# Patient Record
Sex: Female | Born: 1945 | Race: White | Hispanic: No | Marital: Single | Smoking: Never smoker
Health system: Southern US, Community
[De-identification: ages and names within clinical notes are randomized; demographics above are authoritative.]

## PROBLEM LIST (undated history)

## (undated) DIAGNOSIS — G43909 Migraine, unspecified, not intractable, without status migrainosus: Secondary | ICD-10-CM

## (undated) DIAGNOSIS — M1990 Unspecified osteoarthritis, unspecified site: Secondary | ICD-10-CM

## (undated) HISTORY — PX: ELBOW ARTHROSCOPY: SUR87

## (undated) HISTORY — PX: FOOT SURGERY: SHX648

## (undated) HISTORY — PX: KNEE SURGERY: SHX244

---

## 2003-01-24 ENCOUNTER — Encounter: Payer: Self-pay | Admitting: Family Medicine

## 2003-01-24 ENCOUNTER — Ambulatory Visit (HOSPITAL_COMMUNITY): Admission: RE | Admit: 2003-01-24 | Discharge: 2003-01-24 | Payer: Self-pay | Admitting: Family Medicine

## 2008-02-22 ENCOUNTER — Ambulatory Visit (HOSPITAL_COMMUNITY): Admission: RE | Admit: 2008-02-22 | Discharge: 2008-02-22 | Payer: Self-pay | Admitting: Family Medicine

## 2009-01-04 ENCOUNTER — Other Ambulatory Visit: Admission: RE | Admit: 2009-01-04 | Discharge: 2009-01-04 | Payer: Self-pay | Admitting: Obstetrics and Gynecology

## 2010-10-18 ENCOUNTER — Other Ambulatory Visit (HOSPITAL_COMMUNITY): Payer: Self-pay | Admitting: Family Medicine

## 2010-10-18 ENCOUNTER — Ambulatory Visit (HOSPITAL_COMMUNITY)
Admission: RE | Admit: 2010-10-18 | Discharge: 2010-10-18 | Disposition: A | Discharge status: Home / Self Care | Payer: BC Managed Care – PPO | Source: Ambulatory Visit | Attending: Family Medicine | Admitting: Family Medicine

## 2010-10-18 DIAGNOSIS — T148XXA Other injury of unspecified body region, initial encounter: Secondary | ICD-10-CM

## 2010-10-18 DIAGNOSIS — T1490XA Injury, unspecified, initial encounter: Secondary | ICD-10-CM

## 2010-10-18 DIAGNOSIS — M25579 Pain in unspecified ankle and joints of unspecified foot: Secondary | ICD-10-CM | POA: Insufficient documentation

## 2010-10-18 DIAGNOSIS — S99919A Unspecified injury of unspecified ankle, initial encounter: Secondary | ICD-10-CM | POA: Insufficient documentation

## 2010-10-18 DIAGNOSIS — S8990XA Unspecified injury of unspecified lower leg, initial encounter: Secondary | ICD-10-CM | POA: Insufficient documentation

## 2010-10-18 DIAGNOSIS — W2203XA Walked into furniture, initial encounter: Secondary | ICD-10-CM | POA: Insufficient documentation

## 2011-08-28 ENCOUNTER — Ambulatory Visit (HOSPITAL_COMMUNITY)
Admission: RE | Admit: 2011-08-28 | Discharge: 2011-08-28 | Disposition: A | Discharge status: Home / Self Care | Payer: BC Managed Care – PPO | Source: Ambulatory Visit | Attending: Family Medicine | Admitting: Family Medicine

## 2011-08-28 ENCOUNTER — Other Ambulatory Visit (HOSPITAL_COMMUNITY): Payer: Self-pay | Admitting: Family Medicine

## 2011-08-28 DIAGNOSIS — M25569 Pain in unspecified knee: Secondary | ICD-10-CM | POA: Insufficient documentation

## 2011-08-28 DIAGNOSIS — M25561 Pain in right knee: Secondary | ICD-10-CM

## 2011-08-28 DIAGNOSIS — M1120 Other chondrocalcinosis, unspecified site: Secondary | ICD-10-CM | POA: Insufficient documentation

## 2011-08-28 DIAGNOSIS — R609 Edema, unspecified: Secondary | ICD-10-CM

## 2016-07-29 DIAGNOSIS — M79662 Pain in left lower leg: Secondary | ICD-10-CM | POA: Diagnosis not present

## 2016-07-29 DIAGNOSIS — G43909 Migraine, unspecified, not intractable, without status migrainosus: Secondary | ICD-10-CM | POA: Diagnosis not present

## 2016-07-29 DIAGNOSIS — E6609 Other obesity due to excess calories: Secondary | ICD-10-CM | POA: Diagnosis not present

## 2016-07-29 DIAGNOSIS — Z13220 Encounter for screening for lipoid disorders: Secondary | ICD-10-CM | POA: Diagnosis not present

## 2016-07-29 DIAGNOSIS — J3089 Other allergic rhinitis: Secondary | ICD-10-CM | POA: Diagnosis not present

## 2016-12-23 DIAGNOSIS — R21 Rash and other nonspecific skin eruption: Secondary | ICD-10-CM | POA: Diagnosis not present

## 2017-01-29 DIAGNOSIS — Z91010 Allergy to peanuts: Secondary | ICD-10-CM | POA: Diagnosis not present

## 2017-01-29 DIAGNOSIS — E7800 Pure hypercholesterolemia, unspecified: Secondary | ICD-10-CM | POA: Diagnosis not present

## 2017-01-29 DIAGNOSIS — J301 Allergic rhinitis due to pollen: Secondary | ICD-10-CM | POA: Diagnosis not present

## 2017-01-29 DIAGNOSIS — G43909 Migraine, unspecified, not intractable, without status migrainosus: Secondary | ICD-10-CM | POA: Diagnosis not present

## 2017-04-01 DIAGNOSIS — J0100 Acute maxillary sinusitis, unspecified: Secondary | ICD-10-CM | POA: Diagnosis not present

## 2017-07-14 DIAGNOSIS — J209 Acute bronchitis, unspecified: Secondary | ICD-10-CM | POA: Diagnosis not present

## 2017-07-14 DIAGNOSIS — J111 Influenza due to unidentified influenza virus with other respiratory manifestations: Secondary | ICD-10-CM | POA: Diagnosis not present

## 2017-08-06 DIAGNOSIS — J301 Allergic rhinitis due to pollen: Secondary | ICD-10-CM | POA: Diagnosis not present

## 2017-08-06 DIAGNOSIS — E6609 Other obesity due to excess calories: Secondary | ICD-10-CM | POA: Diagnosis not present

## 2017-08-06 DIAGNOSIS — Z79891 Long term (current) use of opiate analgesic: Secondary | ICD-10-CM | POA: Diagnosis not present

## 2017-08-06 DIAGNOSIS — J3089 Other allergic rhinitis: Secondary | ICD-10-CM | POA: Diagnosis not present

## 2017-08-06 DIAGNOSIS — G43909 Migraine, unspecified, not intractable, without status migrainosus: Secondary | ICD-10-CM | POA: Diagnosis not present

## 2017-08-06 DIAGNOSIS — E7800 Pure hypercholesterolemia, unspecified: Secondary | ICD-10-CM | POA: Diagnosis not present

## 2017-08-06 LAB — LAB REPORT - SCANNED
Calcium: 9.7
EGFR: 68

## 2017-08-13 ENCOUNTER — Other Ambulatory Visit: Payer: Self-pay

## 2017-08-13 ENCOUNTER — Emergency Department (HOSPITAL_COMMUNITY)
Admission: EM | Admit: 2017-08-13 | Discharge: 2017-08-13 | Disposition: A | Discharge status: Home / Self Care | Payer: Medicare Other | Attending: Emergency Medicine | Admitting: Emergency Medicine

## 2017-08-13 ENCOUNTER — Encounter (HOSPITAL_COMMUNITY): Payer: Self-pay | Admitting: Emergency Medicine

## 2017-08-13 ENCOUNTER — Emergency Department (HOSPITAL_COMMUNITY): Payer: Medicare Other

## 2017-08-13 DIAGNOSIS — Y999 Unspecified external cause status: Secondary | ICD-10-CM | POA: Diagnosis not present

## 2017-08-13 DIAGNOSIS — Y9389 Activity, other specified: Secondary | ICD-10-CM | POA: Insufficient documentation

## 2017-08-13 DIAGNOSIS — W010XXA Fall on same level from slipping, tripping and stumbling without subsequent striking against object, initial encounter: Secondary | ICD-10-CM | POA: Insufficient documentation

## 2017-08-13 DIAGNOSIS — S3982XA Other specified injuries of lower back, initial encounter: Secondary | ICD-10-CM | POA: Diagnosis present

## 2017-08-13 DIAGNOSIS — Y92481 Parking lot as the place of occurrence of the external cause: Secondary | ICD-10-CM | POA: Diagnosis not present

## 2017-08-13 DIAGNOSIS — M533 Sacrococcygeal disorders, not elsewhere classified: Secondary | ICD-10-CM | POA: Diagnosis not present

## 2017-08-13 DIAGNOSIS — S29012A Strain of muscle and tendon of back wall of thorax, initial encounter: Secondary | ICD-10-CM | POA: Diagnosis not present

## 2017-08-13 DIAGNOSIS — S300XXA Contusion of lower back and pelvis, initial encounter: Secondary | ICD-10-CM | POA: Diagnosis not present

## 2017-08-13 DIAGNOSIS — M542 Cervicalgia: Secondary | ICD-10-CM | POA: Diagnosis not present

## 2017-08-13 HISTORY — DX: Migraine, unspecified, not intractable, without status migrainosus: G43.909

## 2017-08-13 NOTE — ED Triage Notes (Addendum)
Pt was walking in [employer] parking lot on Sunday and slipped on grease, falling on her butt and back.  C/o of tailbone and back pain. Denies hitting head.

## 2017-08-13 NOTE — ED Notes (Signed)
Pt reports she slipped on grease in the parking lot of [facility] Sunday.  Reports landed mostly on her buttocks.  C/O pain from buttocks up to shoulders.  Denies hitting head, denies any loss of consciousness.

## 2017-08-13 NOTE — ED Provider Notes (Signed)
[facility] EMERGENCY DEPARTMENT Provider Note   CSN: [id number] Arrival date & time: 08/13/17  1126     History   Chief Complaint Chief Complaint  Patient presents with  . Fall    HPI Claudia Griffith is a 72 y.o. female.  HPI Patient states that 3 days ago she slipped on some oil in the parking lot and landed on her buttocks.  Denies hitting her head.  No loss of consciousness.  She has had some soreness to the sacral region.  She denies any focal weakness or numbness.  Ambulating without any difficulty.  She is not on any blood thinners.  Patient also complains of some pain to her bilateral shoulders.  Denies any known trauma directly to the shoulders.  No chest pain or shortness of breath. Past Medical History:  Diagnosis Date  . Migraine     There are no active problems to display for this patient.   Past Surgical History:  Procedure Laterality Date  . FOOT SURGERY    . KNEE SURGERY       OB History   None      Home Medications    Prior to Admission medications   Medication Sig Start Date End Date Taking? Authorizing Provider  atorvastatin (LIPITOR) 20 MG tablet Take 1 tablet by mouth daily. 08/06/17   [provider]  HYDROcodone-acetaminophen (NORCO/VICODIN) 5-325 MG tablet Take 1 tablet by mouth 2 (two) times daily as needed. 07/14/17   [provider]    Family History No family history on file.  Social History Social History   Tobacco Use  . Smoking status: Never Smoker  . Smokeless tobacco: Never Used  Substance Use Topics  . Alcohol use: Never    Frequency: Never  . Drug use: Never     Allergies   Ceftin [cefuroxime axetil]; Chocolate; Peanut-containing drug products; Pineapple; and Shellfish allergy   Review of Systems Review of Systems  Constitutional: Negative for chills and fever.  Eyes: Negative for visual disturbance.  Respiratory: Negative for shortness of breath.   Cardiovascular: Negative for chest pain.    Gastrointestinal: Negative for abdominal pain.  Genitourinary: Negative for dysuria, flank pain and frequency.  Musculoskeletal: Positive for back pain, myalgias and neck pain. Negative for arthralgias, gait problem and neck stiffness.  Skin: Negative for rash and wound.  Neurological: Negative for dizziness, syncope, weakness, light-headedness, numbness and headaches.  All other systems reviewed and are negative.    Physical Exam Updated Vital Signs BP 99/85 (BP Location: Right Arm)   Pulse 85   Temp 98.5 F (36.9 C) (Oral)   Resp 17   Ht 5\' 8"  (1.727 m)   Wt 89.4 kg (197 lb)   SpO2 100%   BMI 29.95 kg/m   Physical Exam  Constitutional: She is oriented to person, place, and time. She appears well-developed and well-nourished. No distress.  HENT:  Head: Normocephalic and atraumatic.  Mouth/Throat: Oropharynx is clear and moist.  Eyes: Pupils are equal, round, and reactive to light. EOM are normal.  Neck: Normal range of motion. Neck supple.  No posterior midline cervical tenderness to palpation.  Cardiovascular: Normal rate and regular rhythm.  Pulmonary/Chest: Effort normal and breath sounds normal.  Abdominal: Soft. Bowel sounds are normal. There is no tenderness. There is no rebound and no guarding.  Musculoskeletal: Normal range of motion. She exhibits tenderness. She exhibits no edema.  Patient has mild tenderness over the mid sacrum.  No midline thoracic or lumbar tenderness.  She does have mild bilateral trapezius spasm and tenderness.  Pelvis is stable.  Distal pulses are 2+.  Neurological: She is alert and oriented to person, place, and time.  5/5 motor in all extremities.  Ambulating without difficulty.  Sensation intact.  Skin: Skin is warm and dry. No rash noted. She is not diaphoretic. No erythema.  Psychiatric: She has a normal mood and affect. Her behavior is normal.  Nursing note and vitals reviewed.    ED Treatments / Results  Labs (all labs ordered are  listed, but only abnormal results are displayed) Labs Reviewed - No data to display  EKG None  Radiology Dg Sacrum/coccyx  Result Date: 08/13/2017 CLINICAL DATA:  Fall, left buttock pain, initial encounter. EXAM: SACRUM AND COCCYX - 2+ VIEW COMPARISON:  None. FINDINGS: Obturator rings are intact. Sacroiliac joints are symmetric. Sacrum is grossly intact although somewhat obscured by bowel contents. IMPRESSION: Sacrum appears grossly intact. Electronically Signed   By: Leanna Battles M.D.   On: 08/13/2017 12:16    Procedures Procedures (including critical care time)  Medications Ordered in ED Medications - No data to display   Initial Impression / Assessment and Plan / ED Course  I have reviewed the triage vital signs and the nursing notes.  Pertinent labs & imaging results that were available during my care of the patient were reviewed by me and considered in my medical decision making (see chart for details).     X-ray without acute findings.  We will treat for sacral contusion and upper back muscle strain/spasm.  Return precautions given.  Final Clinical Impressions(s) / ED Diagnoses   Final diagnoses:  Sacral contusion, initial encounter  Muscle strain of upper back    ED Discharge Orders    None       Loren Racer, MD 08/13/17 1240

## 2017-08-27 DIAGNOSIS — T63791A Toxic effect of contact with other venomous plant, accidental (unintentional), initial encounter: Secondary | ICD-10-CM | POA: Diagnosis not present

## 2017-08-27 DIAGNOSIS — E663 Overweight: Secondary | ICD-10-CM | POA: Diagnosis not present

## 2018-02-02 ENCOUNTER — Other Ambulatory Visit (HOSPITAL_COMMUNITY): Payer: Self-pay | Admitting: Family Medicine

## 2018-02-02 DIAGNOSIS — L989 Disorder of the skin and subcutaneous tissue, unspecified: Secondary | ICD-10-CM | POA: Diagnosis not present

## 2018-02-02 DIAGNOSIS — Z1231 Encounter for screening mammogram for malignant neoplasm of breast: Secondary | ICD-10-CM

## 2018-02-02 DIAGNOSIS — E7800 Pure hypercholesterolemia, unspecified: Secondary | ICD-10-CM | POA: Diagnosis not present

## 2018-02-02 DIAGNOSIS — Z91013 Allergy to seafood: Secondary | ICD-10-CM | POA: Diagnosis not present

## 2018-02-02 DIAGNOSIS — G43909 Migraine, unspecified, not intractable, without status migrainosus: Secondary | ICD-10-CM | POA: Diagnosis not present

## 2018-02-03 DIAGNOSIS — Z79899 Other long term (current) drug therapy: Secondary | ICD-10-CM | POA: Diagnosis not present

## 2018-02-03 DIAGNOSIS — E7800 Pure hypercholesterolemia, unspecified: Secondary | ICD-10-CM | POA: Diagnosis not present

## 2018-02-03 DIAGNOSIS — Z79891 Long term (current) use of opiate analgesic: Secondary | ICD-10-CM | POA: Diagnosis not present

## 2018-02-16 ENCOUNTER — Encounter (HOSPITAL_COMMUNITY): Payer: Self-pay

## 2018-02-16 ENCOUNTER — Ambulatory Visit (HOSPITAL_COMMUNITY)
Admission: RE | Admit: 2018-02-16 | Discharge: 2018-02-16 | Disposition: A | Discharge status: Home / Self Care | Payer: Medicare Other | Source: Ambulatory Visit | Attending: Family Medicine | Admitting: Family Medicine

## 2018-02-16 DIAGNOSIS — Z1231 Encounter for screening mammogram for malignant neoplasm of breast: Secondary | ICD-10-CM | POA: Diagnosis not present

## 2018-04-21 DIAGNOSIS — L538 Other specified erythematous conditions: Secondary | ICD-10-CM | POA: Diagnosis not present

## 2018-04-21 DIAGNOSIS — R208 Other disturbances of skin sensation: Secondary | ICD-10-CM | POA: Diagnosis not present

## 2018-04-21 DIAGNOSIS — L821 Other seborrheic keratosis: Secondary | ICD-10-CM | POA: Diagnosis not present

## 2018-04-21 DIAGNOSIS — L820 Inflamed seborrheic keratosis: Secondary | ICD-10-CM | POA: Diagnosis not present

## 2018-07-29 DIAGNOSIS — Z79891 Long term (current) use of opiate analgesic: Secondary | ICD-10-CM | POA: Diagnosis not present

## 2018-07-29 DIAGNOSIS — E7800 Pure hypercholesterolemia, unspecified: Secondary | ICD-10-CM | POA: Diagnosis not present

## 2018-07-29 DIAGNOSIS — G43909 Migraine, unspecified, not intractable, without status migrainosus: Secondary | ICD-10-CM | POA: Diagnosis not present

## 2018-07-29 DIAGNOSIS — J301 Allergic rhinitis due to pollen: Secondary | ICD-10-CM | POA: Diagnosis not present

## 2018-07-29 DIAGNOSIS — J3089 Other allergic rhinitis: Secondary | ICD-10-CM | POA: Diagnosis not present

## 2018-07-29 DIAGNOSIS — E6609 Other obesity due to excess calories: Secondary | ICD-10-CM | POA: Diagnosis not present

## 2019-02-24 DIAGNOSIS — G43909 Migraine, unspecified, not intractable, without status migrainosus: Secondary | ICD-10-CM | POA: Diagnosis not present

## 2019-02-24 DIAGNOSIS — E6609 Other obesity due to excess calories: Secondary | ICD-10-CM | POA: Diagnosis not present

## 2019-02-24 DIAGNOSIS — J301 Allergic rhinitis due to pollen: Secondary | ICD-10-CM | POA: Diagnosis not present

## 2019-02-24 DIAGNOSIS — E7800 Pure hypercholesterolemia, unspecified: Secondary | ICD-10-CM | POA: Diagnosis not present

## 2019-03-29 ENCOUNTER — Other Ambulatory Visit (HOSPITAL_COMMUNITY): Payer: Self-pay | Admitting: Family Medicine

## 2019-03-29 DIAGNOSIS — Z1231 Encounter for screening mammogram for malignant neoplasm of breast: Secondary | ICD-10-CM

## 2019-04-21 ENCOUNTER — Ambulatory Visit (HOSPITAL_COMMUNITY)
Admission: RE | Admit: 2019-04-21 | Discharge: 2019-04-21 | Disposition: A | Discharge status: Home / Self Care | Payer: Medicare Other | Source: Ambulatory Visit | Attending: Family Medicine | Admitting: Family Medicine

## 2019-04-21 ENCOUNTER — Other Ambulatory Visit: Payer: Self-pay

## 2019-04-21 DIAGNOSIS — Z1231 Encounter for screening mammogram for malignant neoplasm of breast: Secondary | ICD-10-CM | POA: Diagnosis not present

## 2019-06-06 IMAGING — MG DIGITAL SCREENING BILATERAL MAMMOGRAM WITH TOMO AND CAD
6 of 11 series · 6 of 31 positions shown · non-contrast
Comparison: Previous exam(s).

CLINICAL DATA: Screening.

EXAM:
DIGITAL SCREENING BILATERAL MAMMOGRAM WITH TOMO AND CAD

[L MLO synth-2D (1 of 2)]
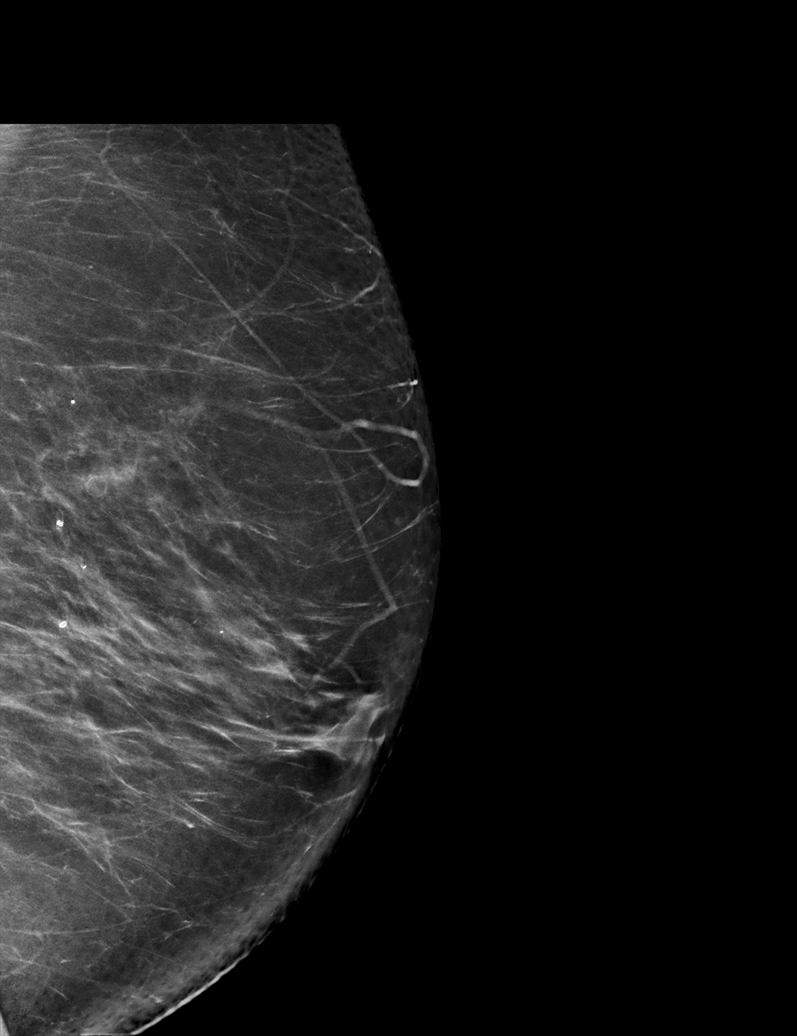

[L MLO synth-2D (2 of 2)]
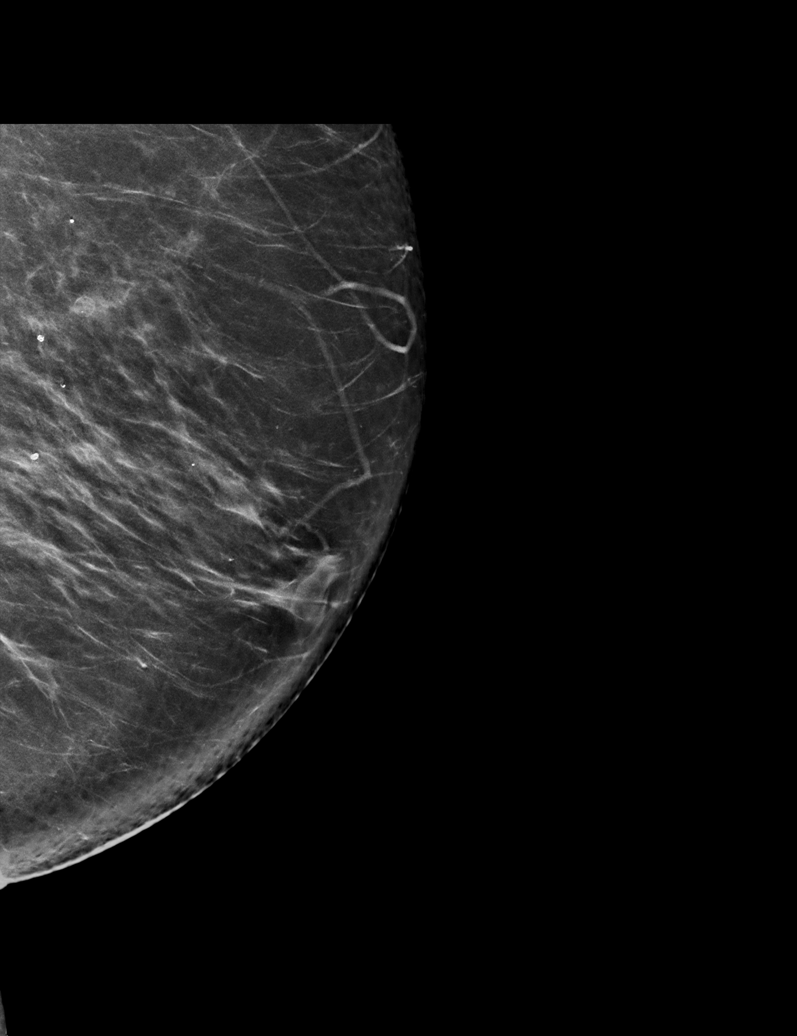

[R MLO synth-2D (1 of 2)]
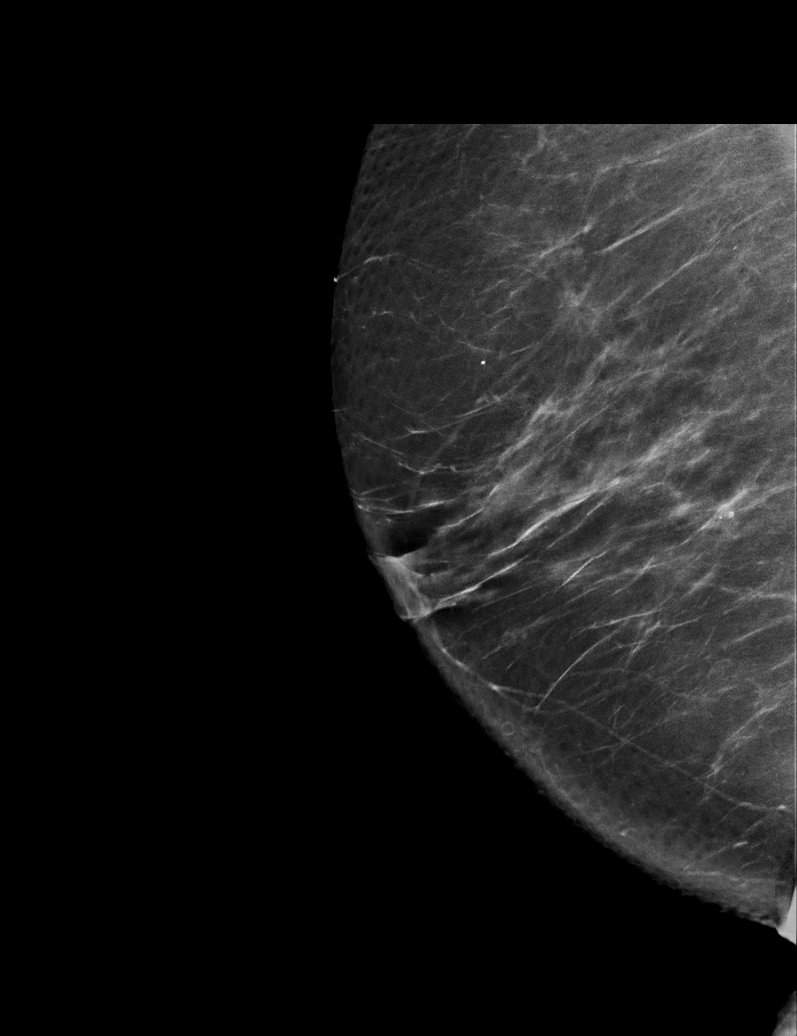

[L CC synth-2D]
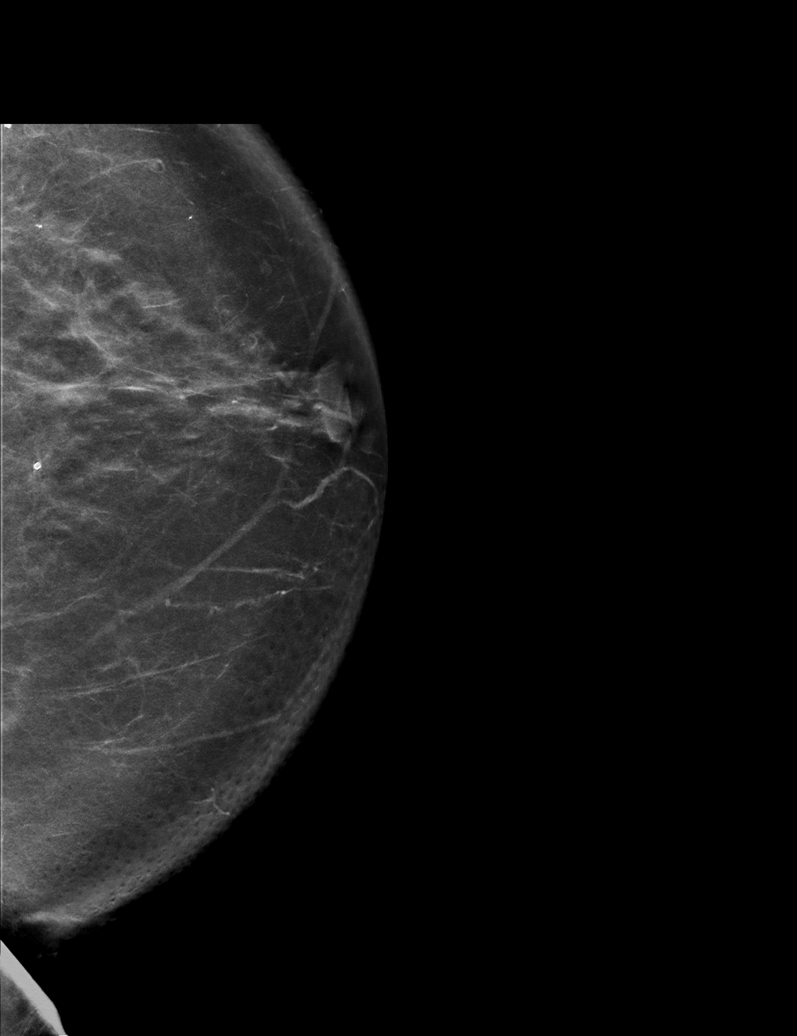

[R MLO synth-2D (2 of 2)]
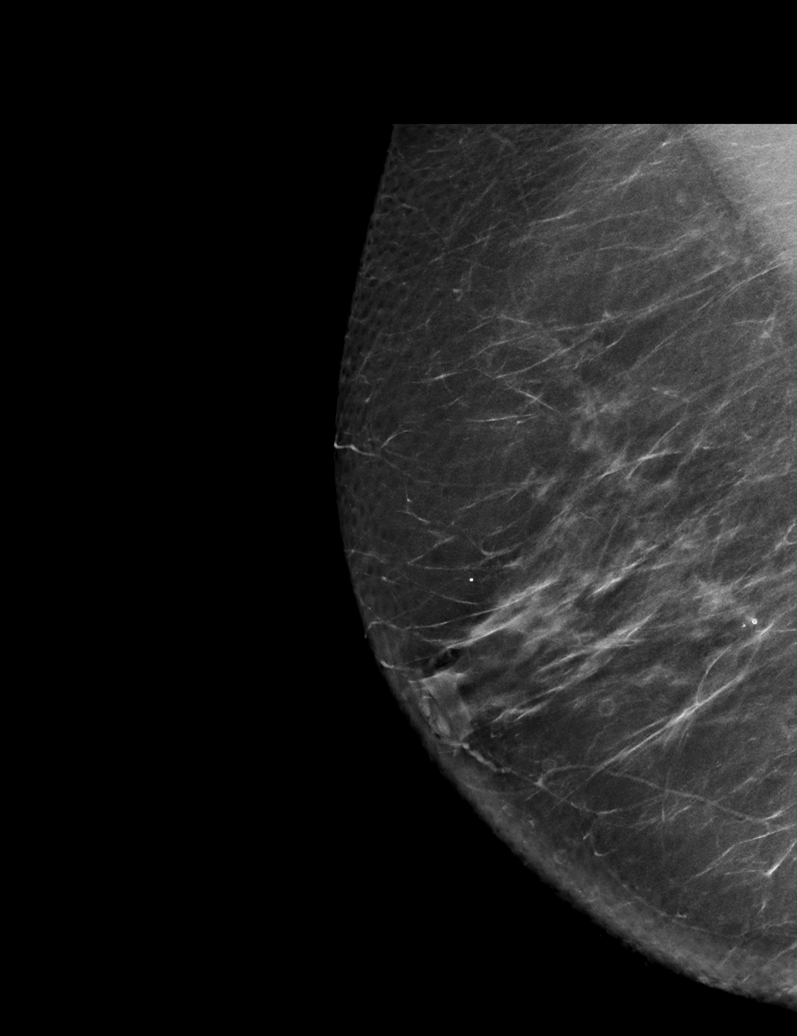

[R CC synth-2D]
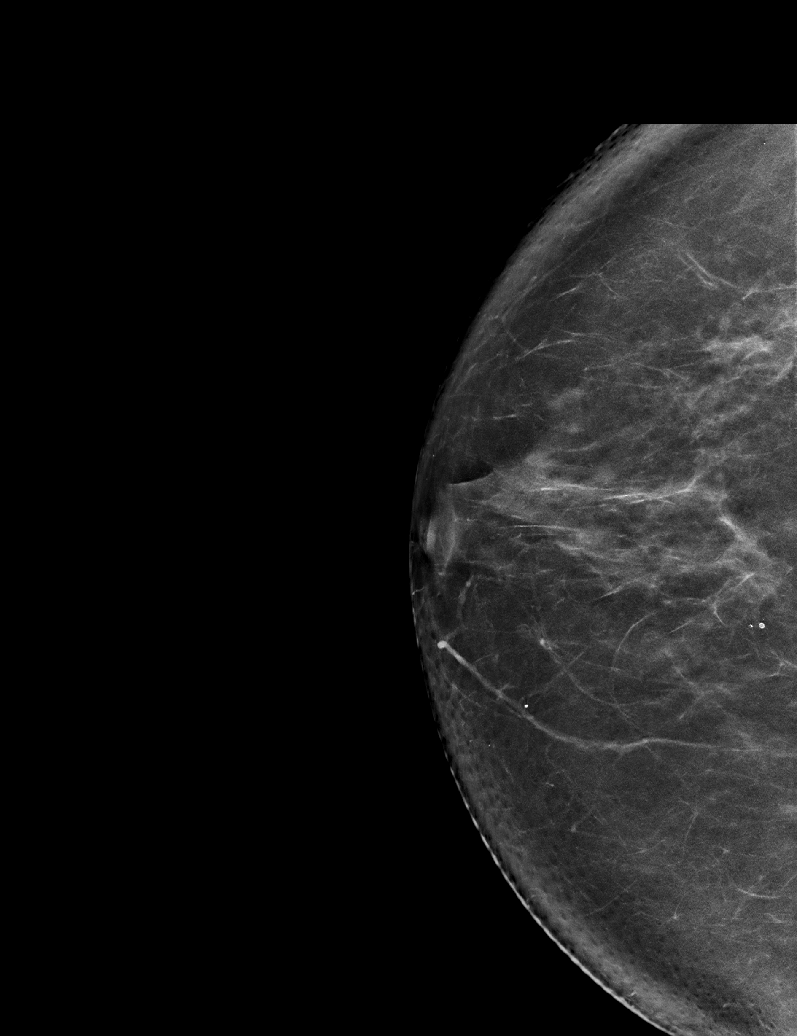

[6 of 31 positions shown; findings below may reference images not displayed]

ACR Breast Density Category b: There are scattered areas of
fibroglandular density.
FINDINGS: There are no findings suspicious for malignancy. Images were
processed with CAD.
IMPRESSION: No mammographic evidence of malignancy. A result letter of this
screening mammogram will be mailed directly to the patient.

RECOMMENDATION:
Screening mammogram in one year. (Code:[id number])

BI-RADS CATEGORY  1: Negative.

## 2021-02-20 DIAGNOSIS — Z79891 Long term (current) use of opiate analgesic: Secondary | ICD-10-CM | POA: Diagnosis not present

## 2021-02-20 DIAGNOSIS — G43909 Migraine, unspecified, not intractable, without status migrainosus: Secondary | ICD-10-CM | POA: Diagnosis not present

## 2021-02-20 DIAGNOSIS — E039 Hypothyroidism, unspecified: Secondary | ICD-10-CM | POA: Diagnosis not present

## 2021-02-20 DIAGNOSIS — Z91010 Allergy to peanuts: Secondary | ICD-10-CM | POA: Diagnosis not present

## 2021-02-20 DIAGNOSIS — Z79899 Other long term (current) drug therapy: Secondary | ICD-10-CM | POA: Diagnosis not present

## 2021-02-20 DIAGNOSIS — E7800 Pure hypercholesterolemia, unspecified: Secondary | ICD-10-CM | POA: Diagnosis not present

## 2021-02-20 DIAGNOSIS — R252 Cramp and spasm: Secondary | ICD-10-CM | POA: Diagnosis not present

## 2021-04-25 DIAGNOSIS — H5203 Hypermetropia, bilateral: Secondary | ICD-10-CM | POA: Diagnosis not present

## 2021-06-20 DIAGNOSIS — M19041 Primary osteoarthritis, right hand: Secondary | ICD-10-CM | POA: Diagnosis not present

## 2021-06-20 DIAGNOSIS — E7800 Pure hypercholesterolemia, unspecified: Secondary | ICD-10-CM | POA: Diagnosis not present

## 2021-06-20 DIAGNOSIS — E6609 Other obesity due to excess calories: Secondary | ICD-10-CM | POA: Diagnosis not present

## 2021-06-20 DIAGNOSIS — M19042 Primary osteoarthritis, left hand: Secondary | ICD-10-CM | POA: Diagnosis not present

## 2021-06-22 DIAGNOSIS — E6609 Other obesity due to excess calories: Secondary | ICD-10-CM | POA: Diagnosis not present

## 2021-06-22 DIAGNOSIS — Z79899 Other long term (current) drug therapy: Secondary | ICD-10-CM | POA: Diagnosis not present

## 2021-06-22 DIAGNOSIS — E039 Hypothyroidism, unspecified: Secondary | ICD-10-CM | POA: Diagnosis not present

## 2021-06-22 DIAGNOSIS — E7800 Pure hypercholesterolemia, unspecified: Secondary | ICD-10-CM | POA: Diagnosis not present

## 2021-07-04 DIAGNOSIS — M19041 Primary osteoarthritis, right hand: Secondary | ICD-10-CM | POA: Diagnosis not present

## 2021-07-04 DIAGNOSIS — M19042 Primary osteoarthritis, left hand: Secondary | ICD-10-CM | POA: Diagnosis not present

## 2021-07-16 DIAGNOSIS — M069 Rheumatoid arthritis, unspecified: Secondary | ICD-10-CM | POA: Diagnosis not present

## 2021-12-17 ENCOUNTER — Other Ambulatory Visit (HOSPITAL_COMMUNITY): Payer: Self-pay | Admitting: Family Medicine

## 2021-12-17 DIAGNOSIS — M25561 Pain in right knee: Secondary | ICD-10-CM

## 2021-12-18 ENCOUNTER — Ambulatory Visit (HOSPITAL_COMMUNITY)
Admission: RE | Admit: 2021-12-18 | Discharge: 2021-12-18 | Disposition: A | Discharge status: Home / Self Care | Payer: Medicare Other | Source: Ambulatory Visit | Attending: Family Medicine | Admitting: Family Medicine

## 2021-12-18 DIAGNOSIS — E7800 Pure hypercholesterolemia, unspecified: Secondary | ICD-10-CM | POA: Diagnosis not present

## 2021-12-18 DIAGNOSIS — M25561 Pain in right knee: Secondary | ICD-10-CM | POA: Diagnosis not present

## 2021-12-18 DIAGNOSIS — E6609 Other obesity due to excess calories: Secondary | ICD-10-CM | POA: Diagnosis not present

## 2021-12-18 DIAGNOSIS — E039 Hypothyroidism, unspecified: Secondary | ICD-10-CM | POA: Diagnosis not present

## 2021-12-18 DIAGNOSIS — Z79891 Long term (current) use of opiate analgesic: Secondary | ICD-10-CM | POA: Diagnosis not present

## 2021-12-25 DIAGNOSIS — E039 Hypothyroidism, unspecified: Secondary | ICD-10-CM | POA: Diagnosis not present

## 2021-12-25 DIAGNOSIS — Z79891 Long term (current) use of opiate analgesic: Secondary | ICD-10-CM | POA: Diagnosis not present

## 2021-12-25 DIAGNOSIS — Z79899 Other long term (current) drug therapy: Secondary | ICD-10-CM | POA: Diagnosis not present

## 2021-12-25 DIAGNOSIS — R799 Abnormal finding of blood chemistry, unspecified: Secondary | ICD-10-CM | POA: Diagnosis not present

## 2022-01-24 DIAGNOSIS — M1711 Unilateral primary osteoarthritis, right knee: Secondary | ICD-10-CM | POA: Diagnosis not present

## 2022-01-29 DIAGNOSIS — M25561 Pain in right knee: Secondary | ICD-10-CM | POA: Diagnosis not present

## 2022-01-29 DIAGNOSIS — M25661 Stiffness of right knee, not elsewhere classified: Secondary | ICD-10-CM | POA: Diagnosis not present

## 2022-02-01 DIAGNOSIS — M25661 Stiffness of right knee, not elsewhere classified: Secondary | ICD-10-CM | POA: Diagnosis not present

## 2022-02-01 DIAGNOSIS — M25561 Pain in right knee: Secondary | ICD-10-CM | POA: Diagnosis not present

## 2022-02-05 DIAGNOSIS — M25661 Stiffness of right knee, not elsewhere classified: Secondary | ICD-10-CM | POA: Diagnosis not present

## 2022-02-05 DIAGNOSIS — M25561 Pain in right knee: Secondary | ICD-10-CM | POA: Diagnosis not present

## 2022-02-07 DIAGNOSIS — M25661 Stiffness of right knee, not elsewhere classified: Secondary | ICD-10-CM | POA: Diagnosis not present

## 2022-02-07 DIAGNOSIS — M25561 Pain in right knee: Secondary | ICD-10-CM | POA: Diagnosis not present

## 2022-02-12 DIAGNOSIS — M25561 Pain in right knee: Secondary | ICD-10-CM | POA: Diagnosis not present

## 2022-02-12 DIAGNOSIS — M25661 Stiffness of right knee, not elsewhere classified: Secondary | ICD-10-CM | POA: Diagnosis not present

## 2022-02-14 DIAGNOSIS — M25661 Stiffness of right knee, not elsewhere classified: Secondary | ICD-10-CM | POA: Diagnosis not present

## 2022-02-14 DIAGNOSIS — M25561 Pain in right knee: Secondary | ICD-10-CM | POA: Diagnosis not present

## 2022-02-18 DIAGNOSIS — M25561 Pain in right knee: Secondary | ICD-10-CM | POA: Diagnosis not present

## 2022-02-18 DIAGNOSIS — M25661 Stiffness of right knee, not elsewhere classified: Secondary | ICD-10-CM | POA: Diagnosis not present

## 2022-02-25 DIAGNOSIS — M25561 Pain in right knee: Secondary | ICD-10-CM | POA: Diagnosis not present

## 2022-02-25 DIAGNOSIS — M25661 Stiffness of right knee, not elsewhere classified: Secondary | ICD-10-CM | POA: Diagnosis not present

## 2022-02-27 DIAGNOSIS — M25661 Stiffness of right knee, not elsewhere classified: Secondary | ICD-10-CM | POA: Diagnosis not present

## 2022-02-27 DIAGNOSIS — M25561 Pain in right knee: Secondary | ICD-10-CM | POA: Diagnosis not present

## 2022-02-28 DIAGNOSIS — M1711 Unilateral primary osteoarthritis, right knee: Secondary | ICD-10-CM | POA: Diagnosis not present

## 2022-03-25 DIAGNOSIS — E7800 Pure hypercholesterolemia, unspecified: Secondary | ICD-10-CM | POA: Diagnosis not present

## 2022-03-25 DIAGNOSIS — R7303 Prediabetes: Secondary | ICD-10-CM | POA: Diagnosis not present

## 2022-03-25 DIAGNOSIS — M069 Rheumatoid arthritis, unspecified: Secondary | ICD-10-CM | POA: Diagnosis not present

## 2022-03-25 DIAGNOSIS — E6609 Other obesity due to excess calories: Secondary | ICD-10-CM | POA: Diagnosis not present

## 2022-04-30 DIAGNOSIS — H25813 Combined forms of age-related cataract, bilateral: Secondary | ICD-10-CM | POA: Diagnosis not present

## 2022-05-22 DIAGNOSIS — H25813 Combined forms of age-related cataract, bilateral: Secondary | ICD-10-CM | POA: Diagnosis not present

## 2022-05-22 DIAGNOSIS — H40003 Preglaucoma, unspecified, bilateral: Secondary | ICD-10-CM | POA: Diagnosis not present

## 2022-05-22 DIAGNOSIS — H5203 Hypermetropia, bilateral: Secondary | ICD-10-CM | POA: Diagnosis not present

## 2022-05-22 DIAGNOSIS — H53022 Refractive amblyopia, left eye: Secondary | ICD-10-CM | POA: Diagnosis not present

## 2022-06-25 NOTE — H&P (Signed)
Surgical History & Physical  Patient Name: Claudia Griffith  DOB: Jan 06, 1946  Surgery: Cataract extraction with intraocular lens implant phacoemulsification; Left Eye Surgeon: Ronn Melena MD Surgery Date: 07/05/2022 Pre-Op Date: 05/22/2022  HPI: A 79 Yr. old female patient 1. 1. The patient complains of difficulty when driving due to glare from headlights or sun, which began 2 months ago. Both eyes are affected. The episode is constant. The condition's severity is worsening. She likes working puzzles. She has noticed her eyes become more tired and not seeing the pieces as well. This is negatively affecting the patient's quality of life and the patient is unable to function adequately in life with the current level of vision.  Medical History: Dry Eyes Glaucoma Cataracts amblyopia OS Arthritis LDL Migraines  Review of Systems Negative Allergic/Immunologic Negative Cardiovascular Negative Constitutional Negative Ear, Nose, Mouth & Throat Negative Endocrine Negative Eyes Negative Gastrointestinal Negative Genitourinary Negative Hemotologic/Lymphatic Negative Integumentary Negative Musculoskeletal Negative Neurological Negative Psychiatry Negative Respiratory  Social Never smoked   Medication Ocular Systemic Systane PF,  Atorvastatin, Hydrocodone, Methotrexate, Aspirin   Sx/Procedures Knee Surgery, Bone spur Rt foot  Drug Allergies  Septra, Peanuts, Shellfish, Pineapple, Seffin, Chocolate  History & Physical: Heent: cataracts NECK: supple without bruits LUNGS: lungs clear to auscultation CV: regular rate and rhythm Abdomen: soft and non-tender  Impression & Plan: Assessment: 1.  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  CATARACT AGE-RELATED COMBINED FORMS; Both Eyes (H25.813) 3.  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  GLAUCOMA SUSPECT; Both Eyes (H40.003)  Plan: 1.  continue with current glasses for now  2.  Cataracts are visually significant and  account for the patient's complaints. Discussed all risks, benefits, procedures and recovery, including infection, loss of vision and eye, need for glasses after surgery or additional procedures. Patient understands changing glasses will not improve vision. Patient indicated understanding of procedure. All questions answered. Patient desires to have surgery, recommend phacoemulsification with intraocular lens. Patient to have preliminary testing necessary (Argos/IOL Master, Mac OCT, TOPO) Educational materials provided.  Plan: - proceed with surgery OS followed by OD - RayOne with -0.25 target - Refractive amblyopia OS mild with asymmetric AL - no fuchs, no DM, ok with lying flat - Omidria and combo drop ok  3.  Stable  4.  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 Otherwise appears healthy. IOP was slightly elevated today with applanation though with some squeezing. Will reassess.

## 2022-06-28 DIAGNOSIS — H2512 Age-related nuclear cataract, left eye: Secondary | ICD-10-CM | POA: Diagnosis not present

## 2022-07-02 ENCOUNTER — Encounter (HOSPITAL_COMMUNITY): Payer: Self-pay

## 2022-07-02 ENCOUNTER — Other Ambulatory Visit: Payer: Self-pay

## 2022-07-02 ENCOUNTER — Encounter (HOSPITAL_COMMUNITY)
Admission: RE | Admit: 2022-07-02 | Discharge: 2022-07-02 | Disposition: A | Discharge status: Home / Self Care | Payer: Medicare Other | Source: Ambulatory Visit | Attending: Optometry | Admitting: Optometry

## 2022-07-02 HISTORY — DX: Unspecified osteoarthritis, unspecified site: M19.90

## 2022-07-05 ENCOUNTER — Encounter (HOSPITAL_COMMUNITY)
Admission: RE | Disposition: A | Discharge status: Home / Self Care | Payer: Self-pay | Source: Home / Self Care | Attending: Optometry

## 2022-07-05 ENCOUNTER — Ambulatory Visit (HOSPITAL_COMMUNITY)
Admission: RE | Admit: 2022-07-05 | Discharge: 2022-07-05 | Disposition: A | Discharge status: Home / Self Care | Payer: Medicare Other | Attending: Optometry | Admitting: Optometry

## 2022-07-05 ENCOUNTER — Ambulatory Visit (HOSPITAL_COMMUNITY): Payer: Medicare Other | Admitting: Anesthesiology

## 2022-07-05 ENCOUNTER — Encounter (HOSPITAL_COMMUNITY): Payer: Self-pay | Admitting: Optometry

## 2022-07-05 ENCOUNTER — Ambulatory Visit (HOSPITAL_BASED_OUTPATIENT_CLINIC_OR_DEPARTMENT_OTHER): Payer: Medicare Other | Admitting: Anesthesiology

## 2022-07-05 DIAGNOSIS — M069 Rheumatoid arthritis, unspecified: Secondary | ICD-10-CM | POA: Insufficient documentation

## 2022-07-05 DIAGNOSIS — H40003 Preglaucoma, unspecified, bilateral: Secondary | ICD-10-CM | POA: Insufficient documentation

## 2022-07-05 DIAGNOSIS — H5203 Hypermetropia, bilateral: Secondary | ICD-10-CM | POA: Diagnosis not present

## 2022-07-05 DIAGNOSIS — H2512 Age-related nuclear cataract, left eye: Secondary | ICD-10-CM

## 2022-07-05 DIAGNOSIS — H53022 Refractive amblyopia, left eye: Secondary | ICD-10-CM | POA: Insufficient documentation

## 2022-07-05 DIAGNOSIS — H25812 Combined forms of age-related cataract, left eye: Secondary | ICD-10-CM

## 2022-07-05 HISTORY — PX: CATARACT EXTRACTION W/PHACO: SHX586

## 2022-07-05 SURGERY — PHACOEMULSIFICATION, CATARACT, WITH IOL INSERTION
Anesthesia: Monitor Anesthesia Care | Site: Eye | Laterality: Left

## 2022-07-05 MED ORDER — MIDAZOLAM HCL 5 MG/5ML IJ SOLN
INTRAMUSCULAR | Status: DC | PRN
  Administered 2022-07-05: 1 mg via INTRAVENOUS

## 2022-07-05 MED ORDER — SIGHTPATH DOSE#1 NA HYALUR & NA CHOND-NA HYALUR IO KIT
PACK | INTRAOCULAR | Status: DC | PRN
  Administered 2022-07-05: 1 via OPHTHALMIC

## 2022-07-05 MED ORDER — LIDOCAINE HCL 3.5 % OP GEL
1.0000 | Freq: Once | OPHTHALMIC | Status: AC
  Administered 2022-07-05: 1 via OPHTHALMIC

## 2022-07-05 MED ORDER — TETRACAINE HCL 0.5 % OP SOLN
1.0000 [drp] | OPHTHALMIC | Status: AC | PRN
  Administered 2022-07-05 (×3): 1 [drp] via OPHTHALMIC

## 2022-07-05 MED ORDER — MIDAZOLAM HCL 2 MG/2ML IJ SOLN
INTRAMUSCULAR | Status: AC
  Filled 2022-07-05: qty 2

## 2022-07-05 MED ORDER — PHENYLEPHRINE-KETOROLAC 1-0.3 % IO SOLN
INTRAOCULAR | Status: DC | PRN
  Administered 2022-07-05: 500 mL via OPHTHALMIC

## 2022-07-05 MED ORDER — SODIUM CHLORIDE 0.9% FLUSH
INTRAVENOUS | Status: DC | PRN
  Administered 2022-07-05: 10 mL via INTRAVENOUS

## 2022-07-05 MED ORDER — FENTANYL CITRATE (PF) 100 MCG/2ML IJ SOLN
INTRAMUSCULAR | Status: DC | PRN
  Administered 2022-07-05: 50 ug via INTRAVENOUS

## 2022-07-05 MED ORDER — PHENYLEPHRINE HCL 2.5 % OP SOLN
1.0000 [drp] | OPHTHALMIC | Status: AC | PRN
  Administered 2022-07-05 (×3): 1 [drp] via OPHTHALMIC

## 2022-07-05 MED ORDER — STERILE WATER FOR IRRIGATION IR SOLN
Status: DC | PRN
  Administered 2022-07-05: 250 mL

## 2022-07-05 MED ORDER — BSS IO SOLN
INTRAOCULAR | Status: DC | PRN
  Administered 2022-07-05: 15 mL via INTRAOCULAR

## 2022-07-05 MED ORDER — NEOMYCIN-POLYMYXIN-DEXAMETH 3.5-10000-0.1 OP SUSP
OPHTHALMIC | Status: DC | PRN
  Administered 2022-07-05: 2 [drp] via OPHTHALMIC

## 2022-07-05 MED ORDER — TROPICAMIDE 1 % OP SOLN
1.0000 [drp] | OPHTHALMIC | Status: AC | PRN
  Administered 2022-07-05 (×3): 1 [drp] via OPHTHALMIC

## 2022-07-05 MED ORDER — FENTANYL CITRATE (PF) 100 MCG/2ML IJ SOLN
INTRAMUSCULAR | Status: AC
  Filled 2022-07-05: qty 2

## 2022-07-05 MED ORDER — POVIDONE-IODINE 5 % OP SOLN
OPHTHALMIC | Status: DC | PRN
  Administered 2022-07-05: 1 via OPHTHALMIC

## 2022-07-05 MED ORDER — LIDOCAINE HCL (PF) 1 % IJ SOLN
INTRAMUSCULAR | Status: DC | PRN
  Administered 2022-07-05: 1 mL

## 2022-07-05 SURGICAL SUPPLY — 15 items
CATARACT SUITE SIGHTPATH (MISCELLANEOUS) ×1 IMPLANT
CLOTH BEACON ORANGE TIMEOUT ST (SAFETY) ×1 IMPLANT
DRSG TEGADERM 4X4.75 (GAUZE/BANDAGES/DRESSINGS) ×1 IMPLANT
EYE SHIELD UNIVERSAL CLEAR (GAUZE/BANDAGES/DRESSINGS) IMPLANT
FEE CATARACT SUITE SIGHTPATH (MISCELLANEOUS) ×1 IMPLANT
GLOVE BIOGEL PI IND STRL 7.0 (GLOVE) ×2 IMPLANT
LENS IOL RAYNER 25.0 (Intraocular Lens) ×1 IMPLANT
LENS IOL RAYONE EMV 25.0 (Intraocular Lens) IMPLANT
NDL HYPO 18GX1.5 BLUNT FILL (NEEDLE) ×1 IMPLANT
NEEDLE HYPO 18GX1.5 BLUNT FILL (NEEDLE) ×1 IMPLANT
PAD ARMBOARD 7.5X6 YLW CONV (MISCELLANEOUS) ×1 IMPLANT
SYR TB 1ML LL NO SAFETY (SYRINGE) ×1 IMPLANT
TAPE SURG TRANSPORE 1 IN (GAUZE/BANDAGES/DRESSINGS) IMPLANT
TAPE SURGICAL TRANSPORE 1 IN (GAUZE/BANDAGES/DRESSINGS) ×1
WATER STERILE IRR 250ML POUR (IV SOLUTION) ×1 IMPLANT

## 2022-07-05 NOTE — Anesthesia Postprocedure Evaluation (Signed)
Anesthesia Post Note  Patient: Claudia Griffith  Procedure(s) Performed: CATARACT EXTRACTION PHACO AND INTRAOCULAR LENS PLACEMENT (IOC) (Left: Eye)  Patient location during evaluation: Phase II Anesthesia Type: MAC Level of consciousness: awake and alert and oriented Pain management: pain level controlled Vital Signs Assessment: post-procedure vital signs reviewed and stable Respiratory status: spontaneous breathing, nonlabored ventilation and respiratory function stable Cardiovascular status: stable and blood pressure returned to baseline Postop Assessment: no apparent nausea or vomiting Anesthetic complications: no  No notable events documented.   Last Vitals:  Vitals:   07/05/22 0922 07/05/22 1039  BP: (!) 150/90 131/69  Pulse:  67  Resp: 18 16  Temp: 36.8 C 36.7 C  SpO2: 99% 100%    Last Pain:  Vitals:   07/05/22 1039  TempSrc: Oral  PainSc: 0-No pain                 Krystalyn Kubota C Emberlee Sortino

## 2022-07-05 NOTE — Interval H&P Note (Signed)
History and Physical Interval Note:  07/05/2022 9:52 AM  The H and P was reviewed and updated. The patient was examined.  No changes were found after exam.  The surgical eye was marked.  Jolana Runkles

## 2022-07-05 NOTE — Op Note (Addendum)
Date of procedure: 07/05/22  Pre-operative diagnosis: Visually significant age-related nuclear cataract, Left Eye (H25.12)  Post-operative diagnosis: Visually significant age-related nuclear cataract, Left Eye  Procedure: Removal of cataract via phacoemulsification and insertion of intra-ocular lens Rayner [id number] +25.0D into the capsular bag of the Left Eye  Attending surgeon: Bryon Lions, MD  Anesthesia: MAC, Topical Akten  Complications: None  Estimated Blood Loss: <15m (minimal)  Specimens: None  Implants:  Implant Name Type Inv. Item Serial No. Manufacturer Lot No. LRB No. Used Action  LENS IOL RAYNER 25.0 - S61 Intraocular Lens LENS IOL RAYNER 25.0 61 SIGHTPATH [id number] Implanted    Indications:  Visually significant age-related cataract, Left Eye  Procedure:  The patient was seen and identified in the pre-operative area. The operative eye was identified and dilated.  The operative eye was marked.  Topical anesthesia was administered to the operative eye.     The patient was then to the operative suite and placed in the supine position.  A timeout was performed confirming the patient, procedure to be performed, and all other relevant information.   The patient's face was prepped and draped in the usual fashion for intra-ocular surgery.  A lid speculum was placed into the operative eye and the surgical microscope moved into place and focused.  An inferotemporal paracentesis was created using a 20 gauge paracentesis blade.  BSS mixed with Omidria, followed by 1% lidocaine was injected into the anterior chamber.  Viscoelastic was injected into the anterior chamber.  A temporal clear-corneal main wound incision was created using a 2.446mmicrokeratome.  A continuous curvilinear capsulorrhexis was initiated using an irrigating cystitome and completed using capsulorrhexis forceps.  Hydrodissection and hydrodeliniation were performed.  Viscoelastic was injected into the  anterior chamber.  A phacoemulsification handpiece and a chopper as a second instrument were used to remove the nucleus and epinucleus. The irrigation/aspiration handpiece was used to remove any remaining cortical material.   The capsular bag was reinflated with viscoelastic, checked, and found to be intact.  The intraocular lens was inserted into the capsular bag.  The irrigation/aspiration handpiece was used to remove any remaining viscoelastic.  The clear corneal wound and paracentesis wounds were then hydrated and checked with Weck-Cels to be watertight.  The lid-speculum and drape was removed, and the patient's face was cleaned with a wet and dry 4x4.  One drop of maxitrol was placed on the ocular surface.  A clear shield was taped over the eye. The patient was taken to the post-operative care unit in good condition, having tolerated the procedure well.  Post-Op Instructions: [facility] and will receive all other orders and instructions.

## 2022-07-05 NOTE — Anesthesia Procedure Notes (Signed)
Procedure Name: MAC Date/Time: 07/05/2022 10:20 AM  Performed by: Maude Leriche, CRNAPre-anesthesia Checklist: Patient identified, Emergency Drugs available, Suction available, Patient being monitored and Timeout performed Patient Re-evaluated:Patient Re-evaluated prior to induction Oxygen Delivery Method: Nasal cannula Placement Confirmation: positive ETCO2 Dental Injury: Teeth and Oropharynx as per pre-operative assessment

## 2022-07-05 NOTE — Transfer of Care (Signed)
Immediate Anesthesia Transfer of Care Note  Patient: Claudia Griffith  Procedure(s) Performed: CATARACT EXTRACTION PHACO AND INTRAOCULAR LENS PLACEMENT (IOC) (Left: Eye)  Patient Location: PACU  Anesthesia Type:MAC  Level of Consciousness: awake, alert , and oriented  Airway & Oxygen Therapy: Patient Spontanous Breathing  Post-op Assessment: Report given to RN, Post -op Vital signs reviewed and stable, Patient moving all extremities X 4, and Patient able to stick tongue midline  Post vital signs: Reviewed  Last Vitals:  Vitals Value Taken Time  BP 131/69 07/05/22 1039  Temp 36.7 C 07/05/22 1039  Pulse 67 07/05/22 1039  Resp 16 07/05/22 1039  SpO2 100 % 07/05/22 1039    Last Pain:  Vitals:   07/05/22 1039  TempSrc: Oral  PainSc: 0-No pain      Patients Stated Pain Goal: 6 ([id number])  Complications: No notable events documented.

## 2022-07-05 NOTE — Discharge Instructions (Addendum)
Please discharge patient when stable, will follow up today with Dr. Snyder at the [facility] [place] office immediately following discharge.  Leave shield in place until visit.  All paperwork with discharge instructions will be given at the office.  [facility] [place] Address:  [address]  [place], [place] [address]  Dr. Snyder's Phone: [phone]  

## 2022-07-05 NOTE — Anesthesia Preprocedure Evaluation (Addendum)
Anesthesia Evaluation  Patient identified by MRN, date of birth, ID band Patient awake    Reviewed: Allergy & Precautions, H&P , NPO status , Patient's Chart, lab work & pertinent test results  Airway Mallampati: I  TM Distance: >3 FB Neck ROM: Full    Dental  (+) Dental Advisory Given, Missing Crowns :   Pulmonary neg pulmonary ROS   Pulmonary exam normal breath sounds clear to auscultation       Cardiovascular negative cardio ROS Normal cardiovascular exam Rhythm:Regular Rate:Normal     Neuro/Psych  Headaches  negative psych ROS   GI/Hepatic negative GI ROS, Neg liver ROS,,,  Endo/Other  negative endocrine ROS    Renal/GU negative Renal ROS  negative genitourinary   Musculoskeletal  (+) Arthritis , Rheumatoid disorders,    Abdominal   Peds negative pediatric ROS (+)  Hematology negative hematology ROS (+)   Anesthesia Other Findings   Reproductive/Obstetrics negative OB ROS                             Anesthesia Physical Anesthesia Plan  ASA: 2  Anesthesia Plan: MAC   Post-op Pain Management: Minimal or no pain anticipated   Induction: Intravenous  PONV Risk Score and Plan: Treatment may vary due to age or medical condition  Airway Management Planned: Nasal Cannula and Natural Airway  Additional Equipment:   Intra-op Plan:   Post-operative Plan:   Informed Consent: I have reviewed the patients History and Physical, chart, labs and discussed the procedure including the risks, benefits and alternatives for the proposed anesthesia with the patient or authorized representative who has indicated his/her understanding and acceptance.     Dental advisory given  Plan Discussed with: CRNA and Surgeon  Anesthesia Plan Comments:        Anesthesia Quick Evaluation

## 2022-07-09 DIAGNOSIS — Z9842 Cataract extraction status, left eye: Secondary | ICD-10-CM | POA: Diagnosis not present

## 2022-07-09 DIAGNOSIS — R7303 Prediabetes: Secondary | ICD-10-CM | POA: Diagnosis not present

## 2022-07-09 DIAGNOSIS — M069 Rheumatoid arthritis, unspecified: Secondary | ICD-10-CM | POA: Diagnosis not present

## 2022-07-09 DIAGNOSIS — Z79899 Other long term (current) drug therapy: Secondary | ICD-10-CM | POA: Diagnosis not present

## 2022-07-10 DIAGNOSIS — R7303 Prediabetes: Secondary | ICD-10-CM | POA: Diagnosis not present

## 2022-07-10 DIAGNOSIS — E6609 Other obesity due to excess calories: Secondary | ICD-10-CM | POA: Diagnosis not present

## 2022-07-10 DIAGNOSIS — Z79899 Other long term (current) drug therapy: Secondary | ICD-10-CM | POA: Diagnosis not present

## 2022-07-10 DIAGNOSIS — E7800 Pure hypercholesterolemia, unspecified: Secondary | ICD-10-CM | POA: Diagnosis not present

## 2022-07-11 LAB — LAB REPORT - SCANNED
A1c: 6.2
Calcium: 9.8
EGFR: 73

## 2022-07-12 ENCOUNTER — Encounter (HOSPITAL_COMMUNITY): Payer: Self-pay | Admitting: Optometry

## 2022-08-14 NOTE — H&P (Signed)
Surgical History & Physical  Patient Name: Claudia Griffith  DOB: 1945/11/13  Surgery: Cataract extraction with intraocular lens implant phacoemulsification; Right Eye Surgeon: Ronn Melena MD Surgery Date: 08/23/2022 Pre-Op Date: 07/16/2022  HPI: A 50 Yr. old female patient is here for PO-OS and to discuss OD. Pt. has no complaints OS.  Patient c/o difficulties with glare on bright sunny days. Patient has difficulties seeing pieces of her puzzles. This is negatively affecting the patient's quality of life and the patient is unable to function adequately in life with the current level of vision OD. HPI was performed by Ronn Melena.  Medical History: Dry Eyes Glaucoma Cataracts History of patching, amblyopia OS Arthritis LDL Migraines  Review of Systems Negative Allergic/Immunologic Negative Cardiovascular Negative Constitutional Negative Ear, Nose, Mouth & Throat Negative Endocrine Negative Eyes Negative Gastrointestinal Negative Genitourinary Negative Hemotologic/Lymphatic Negative Integumentary Musculoskeletal - arthritis  Neurological - migraines Negative Psychiatry Negative Respiratory  Social Never smoked   Medication Systane PF, Ciprofloxacin, Prednisolone acetate 1%,  Atorvastatin, Hydrocodone, Methotrexate, Aspirin  Sx/Procedures Phaco c IOL OS,  Knee Surgery, Bone spur Rt foot  Drug Allergies  Septra, Peanuts, Shellfish, Pineapple, Seffin, Chocolate  History & Physical: Heent: PCIOL OS, cataract OD NECK: supple without bruits LUNGS: lungs clear to auscultation CV: regular rate and rhythm Abdomen: soft and non-tender  Impression & Plan: Assessment: 1.  CATARACT EXTRACTION STATUS; Left Eye (Z98.42) 2.  INTRAOCULAR LENS IOL ; Left Eye (Z96.1) 3.  CATARACT AGE-RELATED COMBINED FORMS; , Right Eye (H25.811) 4.  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  POW1.5. Doing well. All post-op precautions  discussed and instructions reviewed. Written instructions given. Vision limited by amblyopia OS though patient notes improvement in vision.  2. See above   3.  Cataracts are visually significant and account for the patient's complaints. Discussed all risks, benefits, procedures and recovery, including infection, loss of vision and eye, need for glasses after surgery or additional procedures. Patient understands changing glasses will not improve vision. Patient indicated understanding of procedure. All questions answered. Patient desires to have surgery, recommend phacoemulsification with intraocular lens. Patient to have preliminary testing necessary (Argos/IOL Master, Mac OCT, TOPO) Educational materials provided.  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  As above, will monitor for now

## 2022-08-16 DIAGNOSIS — H2511 Age-related nuclear cataract, right eye: Secondary | ICD-10-CM | POA: Diagnosis not present

## 2022-08-19 ENCOUNTER — Encounter (HOSPITAL_COMMUNITY): Payer: Self-pay

## 2022-08-19 ENCOUNTER — Encounter (HOSPITAL_COMMUNITY)
Admission: RE | Admit: 2022-08-19 | Discharge: 2022-08-19 | Disposition: A | Discharge status: Home / Self Care | Payer: Medicare Other | Source: Ambulatory Visit | Attending: Optometry | Admitting: Optometry

## 2022-08-19 ENCOUNTER — Other Ambulatory Visit: Payer: Self-pay

## 2022-08-23 ENCOUNTER — Ambulatory Visit (HOSPITAL_COMMUNITY)
Admission: RE | Admit: 2022-08-23 | Discharge: 2022-08-23 | Disposition: A | Discharge status: Home / Self Care | Payer: Medicare Other | Attending: Optometry | Admitting: Optometry

## 2022-08-23 ENCOUNTER — Ambulatory Visit (HOSPITAL_COMMUNITY): Payer: Medicare Other | Admitting: Certified Registered"

## 2022-08-23 ENCOUNTER — Encounter (HOSPITAL_COMMUNITY)
Admission: RE | Disposition: A | Discharge status: Home / Self Care | Payer: Self-pay | Source: Home / Self Care | Attending: Optometry

## 2022-08-23 ENCOUNTER — Ambulatory Visit (HOSPITAL_BASED_OUTPATIENT_CLINIC_OR_DEPARTMENT_OTHER): Payer: Medicare Other | Admitting: Certified Registered"

## 2022-08-23 ENCOUNTER — Encounter (HOSPITAL_COMMUNITY): Payer: Self-pay | Admitting: Optometry

## 2022-08-23 DIAGNOSIS — H2511 Age-related nuclear cataract, right eye: Secondary | ICD-10-CM

## 2022-08-23 DIAGNOSIS — M1990 Unspecified osteoarthritis, unspecified site: Secondary | ICD-10-CM | POA: Diagnosis not present

## 2022-08-23 DIAGNOSIS — M069 Rheumatoid arthritis, unspecified: Secondary | ICD-10-CM | POA: Diagnosis not present

## 2022-08-23 DIAGNOSIS — G43909 Migraine, unspecified, not intractable, without status migrainosus: Secondary | ICD-10-CM | POA: Insufficient documentation

## 2022-08-23 DIAGNOSIS — H409 Unspecified glaucoma: Secondary | ICD-10-CM | POA: Insufficient documentation

## 2022-08-23 HISTORY — PX: CATARACT EXTRACTION W/PHACO: SHX586

## 2022-08-23 SURGERY — PHACOEMULSIFICATION, CATARACT, WITH IOL INSERTION
Anesthesia: Monitor Anesthesia Care | Site: Eye | Laterality: Right

## 2022-08-23 MED ORDER — BSS IO SOLN
INTRAOCULAR | Status: DC | PRN
  Administered 2022-08-23: 15 mL via INTRAOCULAR

## 2022-08-23 MED ORDER — TROPICAMIDE 1 % OP SOLN
1.0000 [drp] | OPHTHALMIC | Status: AC
  Administered 2022-08-23 (×3): 1 [drp] via OPHTHALMIC

## 2022-08-23 MED ORDER — POVIDONE-IODINE 5 % OP SOLN
OPHTHALMIC | Status: DC | PRN
  Administered 2022-08-23: 1 via OPHTHALMIC

## 2022-08-23 MED ORDER — SODIUM CHLORIDE 0.9% FLUSH
INTRAVENOUS | Status: DC | PRN
  Administered 2022-08-23: 5 mL via INTRAVENOUS

## 2022-08-23 MED ORDER — LIDOCAINE HCL 3.5 % OP GEL
1.0000 | Freq: Once | OPHTHALMIC | Status: AC
  Administered 2022-08-23: 1 via OPHTHALMIC

## 2022-08-23 MED ORDER — TETRACAINE HCL 0.5 % OP SOLN
1.0000 [drp] | OPHTHALMIC | Status: AC
  Administered 2022-08-23 (×3): 1 [drp] via OPHTHALMIC

## 2022-08-23 MED ORDER — STERILE WATER FOR IRRIGATION IR SOLN
Status: DC | PRN
  Administered 2022-08-23: 250 mL

## 2022-08-23 MED ORDER — NEOMYCIN-POLYMYXIN-DEXAMETH 3.5-10000-0.1 OP SUSP
OPHTHALMIC | Status: DC | PRN
  Administered 2022-08-23: 2 [drp] via OPHTHALMIC

## 2022-08-23 MED ORDER — LIDOCAINE HCL (PF) 1 % IJ SOLN
INTRAMUSCULAR | Status: DC | PRN
  Administered 2022-08-23: 1 mL

## 2022-08-23 MED ORDER — FENTANYL CITRATE (PF) 100 MCG/2ML IJ SOLN
INTRAMUSCULAR | Status: DC | PRN
  Administered 2022-08-23: 50 ug via INTRAVENOUS

## 2022-08-23 MED ORDER — SIGHTPATH DOSE#1 NA HYALUR & NA CHOND-NA HYALUR IO KIT
PACK | INTRAOCULAR | Status: DC | PRN
  Administered 2022-08-23: 1 via OPHTHALMIC

## 2022-08-23 MED ORDER — PHENYLEPHRINE-KETOROLAC 1-0.3 % IO SOLN
INTRAOCULAR | Status: DC | PRN
  Administered 2022-08-23: 500 mL via OPHTHALMIC

## 2022-08-23 MED ORDER — FENTANYL CITRATE (PF) 100 MCG/2ML IJ SOLN
INTRAMUSCULAR | Status: AC
  Filled 2022-08-23: qty 2

## 2022-08-23 MED ORDER — PHENYLEPHRINE HCL 2.5 % OP SOLN
1.0000 [drp] | OPHTHALMIC | Status: AC
  Administered 2022-08-23 (×3): 1 [drp] via OPHTHALMIC

## 2022-08-23 MED ORDER — MIDAZOLAM HCL 2 MG/2ML IJ SOLN
INTRAMUSCULAR | Status: DC | PRN
  Administered 2022-08-23: 1 mg via INTRAVENOUS

## 2022-08-23 MED ORDER — MIDAZOLAM HCL 2 MG/2ML IJ SOLN
INTRAMUSCULAR | Status: AC
  Filled 2022-08-23: qty 2

## 2022-08-23 SURGICAL SUPPLY — 15 items
CATARACT SUITE SIGHTPATH (MISCELLANEOUS) ×1 IMPLANT
CLOTH BEACON ORANGE TIMEOUT ST (SAFETY) ×1 IMPLANT
DRSG TEGADERM 4X4.75 (GAUZE/BANDAGES/DRESSINGS) ×1 IMPLANT
EYE SHIELD UNIVERSAL CLEAR (GAUZE/BANDAGES/DRESSINGS) IMPLANT
FEE CATARACT SUITE SIGHTPATH (MISCELLANEOUS) ×1 IMPLANT
GLOVE BIOGEL PI IND STRL 7.0 (GLOVE) ×2 IMPLANT
LENS IOL RAYNER 22.5 (Intraocular Lens) ×1 IMPLANT
LENS IOL RAYONE EMV 22.5 (Intraocular Lens) IMPLANT
NDL HYPO 18GX1.5 BLUNT FILL (NEEDLE) ×1 IMPLANT
NEEDLE HYPO 18GX1.5 BLUNT FILL (NEEDLE) ×1 IMPLANT
PAD ARMBOARD 7.5X6 YLW CONV (MISCELLANEOUS) ×1 IMPLANT
RING MALYGIN 7.0 (MISCELLANEOUS) IMPLANT
SYR TB 1ML LL NO SAFETY (SYRINGE) ×1 IMPLANT
TAPE SURG TRANSPORE 1 IN (GAUZE/BANDAGES/DRESSINGS) IMPLANT
WATER STERILE IRR 250ML POUR (IV SOLUTION) ×1 IMPLANT

## 2022-08-23 NOTE — Interval H&P Note (Signed)
History and Physical Interval Note:  08/23/2022 9:29 AM  The H and P was reviewed and updated. The patient was examined.  No changes were found after exam.  The surgical eye was marked.  Claudia Griffith

## 2022-08-23 NOTE — Anesthesia Postprocedure Evaluation (Signed)
Anesthesia Post Note  Patient: Claudia Griffith  Procedure(s) Performed: CATARACT EXTRACTION PHACO AND INTRAOCULAR LENS PLACEMENT (IOC) (Right: Eye)  Patient location during evaluation: Phase II Anesthesia Type: MAC Level of consciousness: awake Pain management: pain level controlled Vital Signs Assessment: post-procedure vital signs reviewed and stable Respiratory status: spontaneous breathing and respiratory function stable Cardiovascular status: blood pressure returned to baseline and stable Postop Assessment: no headache and no apparent nausea or vomiting Anesthetic complications: no Comments: Late entry   No notable events documented.   Last Vitals:  Vitals:   08/23/22 0924 08/23/22 1021  BP: (!) 140/62 (!) 148/74  Pulse: 71 73  Resp: 19   Temp: 36.7 C 36.9 C  SpO2: 100% 100%    Last Pain:  Vitals:   08/23/22 1021  TempSrc: Oral  PainSc: 0-No pain                 Windell Norfolk

## 2022-08-23 NOTE — Transfer of Care (Signed)
Immediate Anesthesia Transfer of Care Note  Patient: Claudia Griffith  Procedure(s) Performed: CATARACT EXTRACTION PHACO AND INTRAOCULAR LENS PLACEMENT (IOC) (Right: Eye)  Patient Location: Short Stay  Anesthesia Type:MAC  Level of Consciousness: awake, alert , and oriented  Airway & Oxygen Therapy: Patient Spontanous Breathing  Post-op Assessment: Report given to RN and Post -op Vital signs reviewed and stable  Post vital signs: Reviewed and stable  Last Vitals:  Vitals Value Taken Time  BP    Temp    Pulse    Resp    SpO2      Last Pain:  Vitals:   08/23/22 0924  TempSrc: Oral  PainSc: 0-No pain         Complications: No notable events documented.

## 2022-08-23 NOTE — Discharge Instructions (Signed)
Please discharge patient when stable, will follow up today with Dr. Renaldo Gornick at the [facility] [place] office immediately following discharge.  Leave shield in place until visit.  All paperwork with discharge instructions will be given at the office.  [facility] [place] Address:  [address]  [place], [place] [address]  Dr. Shalice Woodring's Phone: [phone]  

## 2022-08-23 NOTE — Anesthesia Preprocedure Evaluation (Signed)
Anesthesia Evaluation  Patient identified by MRN, date of birth, ID band Patient awake    Reviewed: Allergy & Precautions, H&P , NPO status , Patient's Chart, lab work & pertinent test results  Airway Mallampati: I  TM Distance: >3 FB Neck ROM: Full    Dental  (+) Dental Advisory Given, Missing Crowns :   Pulmonary neg pulmonary ROS   Pulmonary exam normal breath sounds clear to auscultation       Cardiovascular negative cardio ROS Normal cardiovascular exam Rhythm:Regular Rate:Normal     Neuro/Psych  Headaches  negative psych ROS   GI/Hepatic negative GI ROS, Neg liver ROS,,,  Endo/Other  negative endocrine ROS    Renal/GU negative Renal ROS  negative genitourinary   Musculoskeletal  (+) Arthritis , Rheumatoid disorders,    Abdominal   Peds negative pediatric ROS (+)  Hematology negative hematology ROS (+)   Anesthesia Other Findings   Reproductive/Obstetrics negative OB ROS                             Anesthesia Physical Anesthesia Plan  ASA: 2  Anesthesia Plan: MAC   Post-op Pain Management: Minimal or no pain anticipated   Induction: Intravenous  PONV Risk Score and Plan: Treatment may vary due to age or medical condition  Airway Management Planned: Nasal Cannula and Natural Airway  Additional Equipment:   Intra-op Plan:   Post-operative Plan:   Informed Consent: I have reviewed the patients History and Physical, chart, labs and discussed the procedure including the risks, benefits and alternatives for the proposed anesthesia with the patient or authorized representative who has indicated his/her understanding and acceptance.     Dental advisory given  Plan Discussed with: CRNA and Surgeon  Anesthesia Plan Comments:        Anesthesia Quick Evaluation  

## 2022-08-23 NOTE — Op Note (Addendum)
Date of procedure: 08/23/22  Pre-operative diagnosis: Visually significant age-related nuclear cataract, Right Eye (H25.11)  Post-operative diagnosis: Visually significant age-related nuclear cataract, Right Eye  Procedure: Removal of cataract via phacoemulsification and insertion of intra-ocular lens Rayner [id number] +22.5D into the capsular bag of the Right Eye  Attending surgeon: Pecolia Ades, MD  Anesthesia: MAC, Topical Akten  Complications: None  Estimated Blood Loss: <57mL (minimal)  Specimens: None  Implants:  Implant Name Type Inv. Item Serial No. Manufacturer Lot No. LRB No. Used Action  LENS IOL RAYNER 22.5 - S03 Intraocular Lens LENS IOL RAYNER 22.5 03 [employer] [id number] Right 1 Implanted    Indications:  Visually significant age-related cataract, Right Eye  Procedure:  The patient was seen and identified in the pre-operative area. The operative eye was identified and dilated.  The operative eye was marked.  Topical anesthesia was administered to the operative eye.     The patient was then to the operative suite and placed in the supine position.  A timeout was performed confirming the patient, procedure to be performed, and all other relevant information.   The patient's face was prepped and draped in the usual fashion for intra-ocular surgery.  A lid speculum was placed into the operative eye and the surgical microscope moved into place and focused.  A superotemporal paracentesis was created using a 20 gauge paracentesis blade.  BSS mixed with Omidria, followed by 1% lidocaine was injected into the anterior chamber.  Viscoelastic was injected into the anterior chamber.  A temporal clear-corneal main wound incision was created using a 2.80mm microkeratome.  A continuous curvilinear capsulorrhexis was initiated using an irrigating cystitome and completed using capsulorrhexis forceps.  Hydrodissection and hydrodeliniation were performed.  Viscoelastic was injected into the  anterior chamber.  A phacoemulsification handpiece and a chopper as a second instrument were used to remove the nucleus and epinucleus. The irrigation/aspiration handpiece was used to remove any remaining cortical material.   The capsular bag was reinflated with viscoelastic, checked, and found to be intact.  The intraocular lens was inserted into the capsular bag.  The irrigation/aspiration handpiece was used to remove any remaining viscoelastic.  The clear corneal wound and paracentesis wounds were then hydrated and checked with Weck-Cels to be watertight.  The lid-speculum and drape was removed, and the patient's face was cleaned with a wet and dry 4x4.  Maxitrol drops were instilled onto the eye. A clear shield was taped over the eye. The patient was taken to the post-operative care unit in good condition, having tolerated the procedure well.  Post-Op Instructions: The patient will follow up at [facility] for a same day post-operative evaluation and will receive all other orders and instructions.

## 2022-08-23 NOTE — Anesthesia Procedure Notes (Signed)
Procedure Name: MAC Date/Time: 08/23/2022 10:04 AM  Performed by: Julian Reil, CRNAPre-anesthesia Checklist: Patient identified, Emergency Drugs available, Suction available and Patient being monitored Patient Re-evaluated:Patient Re-evaluated prior to induction Oxygen Delivery Method: Nasal cannula Placement Confirmation: positive ETCO2

## 2022-08-27 DIAGNOSIS — J3089 Other allergic rhinitis: Secondary | ICD-10-CM | POA: Diagnosis not present

## 2022-08-29 ENCOUNTER — Encounter (HOSPITAL_COMMUNITY): Payer: Self-pay | Admitting: Optometry

## 2022-09-02 DIAGNOSIS — J019 Acute sinusitis, unspecified: Secondary | ICD-10-CM | POA: Diagnosis not present

## 2022-10-09 ENCOUNTER — Other Ambulatory Visit (HOSPITAL_COMMUNITY): Payer: Self-pay | Admitting: Obstetrics and Gynecology

## 2022-10-09 ENCOUNTER — Other Ambulatory Visit (HOSPITAL_COMMUNITY): Payer: Self-pay | Admitting: Family Medicine

## 2022-10-09 DIAGNOSIS — G43909 Migraine, unspecified, not intractable, without status migrainosus: Secondary | ICD-10-CM | POA: Diagnosis not present

## 2022-10-09 DIAGNOSIS — M069 Rheumatoid arthritis, unspecified: Secondary | ICD-10-CM | POA: Diagnosis not present

## 2022-10-09 DIAGNOSIS — E7800 Pure hypercholesterolemia, unspecified: Secondary | ICD-10-CM | POA: Diagnosis not present

## 2022-10-09 DIAGNOSIS — R7303 Prediabetes: Secondary | ICD-10-CM | POA: Diagnosis not present

## 2022-10-09 DIAGNOSIS — Z79899 Other long term (current) drug therapy: Secondary | ICD-10-CM | POA: Diagnosis not present

## 2022-10-09 DIAGNOSIS — Z1231 Encounter for screening mammogram for malignant neoplasm of breast: Secondary | ICD-10-CM

## 2022-10-09 DIAGNOSIS — Z780 Asymptomatic menopausal state: Secondary | ICD-10-CM

## 2022-10-09 DIAGNOSIS — Z79891 Long term (current) use of opiate analgesic: Secondary | ICD-10-CM | POA: Diagnosis not present

## 2022-10-09 DIAGNOSIS — E6609 Other obesity due to excess calories: Secondary | ICD-10-CM | POA: Diagnosis not present

## 2022-10-10 ENCOUNTER — Ambulatory Visit (HOSPITAL_COMMUNITY)
Admission: RE | Admit: 2022-10-10 | Discharge: 2022-10-10 | Disposition: A | Discharge status: Home / Self Care | Payer: Medicare Other | Source: Ambulatory Visit | Attending: Family Medicine | Admitting: Family Medicine

## 2022-10-10 ENCOUNTER — Encounter (HOSPITAL_COMMUNITY): Payer: Self-pay

## 2022-10-10 DIAGNOSIS — Z1231 Encounter for screening mammogram for malignant neoplasm of breast: Secondary | ICD-10-CM | POA: Diagnosis not present

## 2022-10-10 LAB — LAB REPORT - SCANNED
Calcium: 9.6
EGFR: 70

## 2022-10-15 ENCOUNTER — Ambulatory Visit (HOSPITAL_COMMUNITY)
Admission: RE | Admit: 2022-10-15 | Discharge: 2022-10-15 | Disposition: A | Discharge status: Home / Self Care | Payer: Medicare Other | Source: Ambulatory Visit | Attending: Obstetrics and Gynecology | Admitting: Obstetrics and Gynecology

## 2022-10-15 DIAGNOSIS — Z780 Asymptomatic menopausal state: Secondary | ICD-10-CM | POA: Insufficient documentation

## 2022-10-17 DIAGNOSIS — R799 Abnormal finding of blood chemistry, unspecified: Secondary | ICD-10-CM | POA: Diagnosis not present

## 2022-10-17 DIAGNOSIS — R6889 Other general symptoms and signs: Secondary | ICD-10-CM | POA: Diagnosis not present

## 2022-10-17 DIAGNOSIS — D649 Anemia, unspecified: Secondary | ICD-10-CM | POA: Diagnosis not present

## 2022-10-17 DIAGNOSIS — R899 Unspecified abnormal finding in specimens from other organs, systems and tissues: Secondary | ICD-10-CM | POA: Diagnosis not present

## 2022-12-06 DIAGNOSIS — Z79899 Other long term (current) drug therapy: Secondary | ICD-10-CM | POA: Diagnosis not present

## 2022-12-06 DIAGNOSIS — M069 Rheumatoid arthritis, unspecified: Secondary | ICD-10-CM | POA: Diagnosis not present

## 2022-12-06 DIAGNOSIS — E7800 Pure hypercholesterolemia, unspecified: Secondary | ICD-10-CM | POA: Diagnosis not present

## 2022-12-06 DIAGNOSIS — G43909 Migraine, unspecified, not intractable, without status migrainosus: Secondary | ICD-10-CM | POA: Diagnosis not present

## 2023-02-11 DIAGNOSIS — H5213 Myopia, bilateral: Secondary | ICD-10-CM | POA: Diagnosis not present

## 2023-03-18 ENCOUNTER — Encounter: Payer: Self-pay | Admitting: Internal Medicine

## 2023-03-18 ENCOUNTER — Ambulatory Visit (INDEPENDENT_AMBULATORY_CARE_PROVIDER_SITE_OTHER): Payer: Medicare Other | Admitting: Internal Medicine

## 2023-03-18 VITALS — BP 153/75 | HR 96 | Ht 68.5 in | Wt 205.0 lb

## 2023-03-18 DIAGNOSIS — F1190 Opioid use, unspecified, uncomplicated: Secondary | ICD-10-CM

## 2023-03-18 DIAGNOSIS — Z8669 Personal history of other diseases of the nervous system and sense organs: Secondary | ICD-10-CM

## 2023-03-18 DIAGNOSIS — Z87892 Personal history of anaphylaxis: Secondary | ICD-10-CM | POA: Insufficient documentation

## 2023-03-18 DIAGNOSIS — Z1159 Encounter for screening for other viral diseases: Secondary | ICD-10-CM | POA: Diagnosis not present

## 2023-03-18 DIAGNOSIS — E785 Hyperlipidemia, unspecified: Secondary | ICD-10-CM | POA: Insufficient documentation

## 2023-03-18 DIAGNOSIS — M069 Rheumatoid arthritis, unspecified: Secondary | ICD-10-CM | POA: Diagnosis not present

## 2023-03-18 DIAGNOSIS — E782 Mixed hyperlipidemia: Secondary | ICD-10-CM

## 2023-03-18 DIAGNOSIS — R7303 Prediabetes: Secondary | ICD-10-CM | POA: Insufficient documentation

## 2023-03-18 DIAGNOSIS — E66811 Obesity, class 1: Secondary | ICD-10-CM

## 2023-03-18 DIAGNOSIS — M1990 Unspecified osteoarthritis, unspecified site: Secondary | ICD-10-CM

## 2023-03-18 MED ORDER — EPINEPHRINE 0.3 MG/0.3ML IJ SOAJ
0.3000 mg | INTRAMUSCULAR | 0 refills | Status: DC | PRN
Start: 2023-03-18 — End: 2023-11-05

## 2023-03-18 NOTE — Progress Notes (Signed)
New Patient Office Visit  Subjective    Patient ID: Claudia Griffith, female    DOB: 1945-07-02  Age: 77 y.o. MRN: [id number]  CC:  Chief Complaint  Patient presents with   Establish Care    HPI Claudia Griffith presents to establish care.  She is a 77 year old woman who endorses a past medical history significant for arthritis (RA vs OA?), HLD, migraines, anaphylaxis, and prediabetes.  Previously followed by Dr. Sudie Bailey.  Claudia Griffith reports feeling well today.  She is asymptomatic and has no acute concerns to discuss aside from desiring to establish care.  She currently works at [employer] and denies tobacco, alcohol, and illicit drug use.  Her family medical history is significant for CAD in multiple family members, Parkinson's disease, and lung cancer.  Chronic medical conditions and outstanding preventative care items discussed today are individually addressed 9/below.   Outpatient Encounter Medications as of 03/18/2023  Medication Sig   aspirin EC 81 MG tablet Take 81 mg by mouth daily. Swallow whole. Only takes 2-3 times weekly.   atorvastatin (LIPITOR) 20 MG tablet Take 1 tablet by mouth daily.   EPINEPHrine (EPIPEN 2-PAK) 0.3 mg/0.3 mL IJ SOAJ injection Inject 0.3 mg into the muscle as needed for anaphylaxis.   HYDROcodone-acetaminophen (NORCO/VICODIN) 5-325 MG tablet Take 1 tablet by mouth 2 (two) times daily as needed.   methotrexate (RHEUMATREX) 7.5 MG tablet Take 7.5 mg by mouth once a week. Caution" Chemotherapy. Protect from light. Take on Saturday   [DISCONTINUED] EPINEPHRINE, ANAPHYLAXIS, IJ Inject as directed.   No facility-administered encounter medications on file as of 03/18/2023.    Past Medical History:  Diagnosis Date   Arthritis    Migraine     Past Surgical History:  Procedure Laterality Date   CATARACT EXTRACTION W/PHACO Left 07/05/2022   Procedure: CATARACT EXTRACTION PHACO AND INTRAOCULAR LENS PLACEMENT (IOC);  Surgeon: Pecolia Ades, MD;  Location: [facility];  Service: Ophthalmology;  Laterality: Left;  CDE: 3.73   CATARACT EXTRACTION W/PHACO Right 08/23/2022   Procedure: CATARACT EXTRACTION PHACO AND INTRAOCULAR LENS PLACEMENT (IOC);  Surgeon: Pecolia Ades, MD;  Location: [facility];  Service: Ophthalmology;  Laterality: Right;  CDE: 7.51   ELBOW ARTHROSCOPY Left    FOOT SURGERY Right    bone spur and hammer toe repair   KNEE SURGERY Right    x2 arthroscopy    History reviewed. No pertinent family history.  Social History   Socioeconomic History   Marital status: Single    Spouse name: Not on file   Number of children: Not on file   Years of education: Not on file   Highest education level: Not on file  Occupational History   Not on file  Tobacco Use   Smoking status: Never   Smokeless tobacco: Never  Vaping Use   Vaping status: Never Used  Substance and Sexual Activity   Alcohol use: Never   Drug use: Never   Sexual activity: Not on file  Other Topics Concern   Not on file  Social History Narrative   Not on file   Social Determinants of Health   Financial Resource Strain: Not on file  Food Insecurity: Not on file  Transportation Needs: Not on file  Physical Activity: Not on file  Stress: Not on file  Social Connections: Not on file  Intimate Partner Violence: Not on file    Review of Systems  Constitutional:  Negative for chills and fever.  HENT:  Negative  for sore throat.   Respiratory:  Negative for cough and shortness of breath.   Cardiovascular:  Negative for chest pain, palpitations and leg swelling.  Gastrointestinal:  Negative for abdominal pain, blood in stool, constipation, diarrhea, nausea and vomiting.  Genitourinary:  Negative for dysuria and hematuria.  Musculoskeletal:  Negative for myalgias.  Skin:  Negative for itching and rash.  Neurological:  Negative for dizziness and headaches.  Psychiatric/Behavioral:  Negative for depression and suicidal ideas.     Objective    BP (!) 153/75 (BP Location: Right Arm, Patient Position: Sitting, Cuff Size: Large)   Pulse 96   Ht 5' 8.5" (1.74 m)   Wt 205 lb (93 kg)   SpO2 95%   BMI 30.72 kg/m   Physical Exam Vitals reviewed.  Constitutional:      General: She is not in acute distress.    Appearance: Normal appearance. She is obese. She is not toxic-appearing.  HENT:     Head: Normocephalic and atraumatic.     Right Ear: External ear normal.     Left Ear: External ear normal.     Nose: Nose normal. No congestion or rhinorrhea.     Mouth/Throat:     Mouth: Mucous membranes are moist.     Pharynx: Oropharynx is clear. No oropharyngeal exudate or posterior oropharyngeal erythema.  Eyes:     General: No scleral icterus.    Extraocular Movements: Extraocular movements intact.     Conjunctiva/sclera: Conjunctivae normal.     Pupils: Pupils are equal, round, and reactive to light.  Cardiovascular:     Rate and Rhythm: Normal rate and regular rhythm.     Pulses: Normal pulses.     Heart sounds: Normal heart sounds. No murmur heard.    No friction rub. No gallop.  Pulmonary:     Effort: Pulmonary effort is normal.     Breath sounds: Normal breath sounds. No wheezing, rhonchi or rales.  Abdominal:     General: Abdomen is flat. Bowel sounds are normal. There is no distension.     Palpations: Abdomen is soft.     Tenderness: There is no abdominal tenderness.  Musculoskeletal:        General: No swelling. Normal range of motion.     Cervical back: Normal range of motion.     Right lower leg: No edema.     Left lower leg: No edema.  Lymphadenopathy:     Cervical: No cervical adenopathy.  Skin:    General: Skin is warm and dry.     Capillary Refill: Capillary refill takes less than 2 seconds.     Coloration: Skin is not jaundiced.  Neurological:     General: No focal deficit present.     Mental Status: She is alert and oriented to person, place, and time.  Psychiatric:        Mood and  Affect: Mood normal.        Behavior: Behavior normal.    Assessment & Plan:   Problem List Items Addressed This Visit       Arthritis    RA vs OA?  Known history of osteoarthritis of the right knee.  She has previously been evaluated by orthopedic surgery and received intra-articular corticosteroid injections.  She states that she is currently being treated for RA at the first MCP of the right hand.  This is the only site that she has RA.  She is currently prescribed methotrexate 7.5 mg weekly, which has improved her  symptoms.  She is interested in stopping methotrexate due to concern for long-term side effects.  She is also taking Norco 5-325 mg as needed for pain relief, which amounts to roughly once weekly per patient. -No medication changes today.  I agree with attempting to stop methotrexate at future appointment as her symptoms and exam findings seem more consistent with OA vs RA.  No previous autoimmune workup is available for review.      Hyperlipidemia    She is currently prescribed atorvastatin 20 mg daily.  Lipid panel last updated in February 2024.  Repeat lipid panel ordered today.      History of migraine headaches    Reports taking hydrocodone-acetaminophen 5-3 and 25 mg as needed for migraine relief, which amounts to roughly once per week.  She has not tried any other medication previously for treatment of migraines.  PDMP reviewed.  No medication changes made today.      Prediabetes    A1c 6.2 on previous labs.  Repeat A1c ordered today.      History of anaphylaxis - Primary    She endorses a history of anaphylaxis for which she is prescribed an EpiPen.  This has been refilled today at her request.      Chronic, continuous use of opioids    She is prescribed hydrocodone-acetaminophen 5 to 325 mg as needed for musculoskeletal pain and migraines.  PDMP reviewed.  Prescription was last filled in July as 90 tablets.  She has roughly half a bottle of medication  remaining. -Controlled substance agreement and UDS are pending      Return in about 3 months (around 06/18/2023).   Billie Lade, MD

## 2023-03-18 NOTE — Assessment & Plan Note (Signed)
She endorses a history of anaphylaxis for which she is prescribed an EpiPen.  This has been refilled today at her request.

## 2023-03-18 NOTE — Assessment & Plan Note (Signed)
She is currently prescribed atorvastatin 20 mg daily.  Lipid panel last updated in February 2024.  Repeat lipid panel ordered today.

## 2023-03-18 NOTE — Patient Instructions (Signed)
It was a pleasure to see you today.  Thank you for giving [place] the opportunity to be involved in your care.  Below is a brief recap of your visit and next steps.  We will plan to see you again in 3 months.  Summary You have established care today We will check basic labs No medication changes were made Epipen refilled Controlled substance agreement and urine drug screen pending Follow up in 3 months

## 2023-03-18 NOTE — Assessment & Plan Note (Signed)
She is prescribed hydrocodone-acetaminophen 5 to 325 mg as needed for musculoskeletal pain and migraines.  PDMP reviewed.  Prescription was last filled in July as 90 tablets.  She has roughly half a bottle of medication remaining. -Controlled substance agreement and UDS are pending

## 2023-03-18 NOTE — Assessment & Plan Note (Signed)
A1c 6.2 on previous labs.  Repeat A1c ordered today.

## 2023-03-18 NOTE — Assessment & Plan Note (Signed)
RA vs OA?  Known history of osteoarthritis of the right knee.  She has previously been evaluated by orthopedic surgery and received intra-articular corticosteroid injections.  She states that she is currently being treated for RA at the first MCP of the right hand.  This is the only site that she has RA.  She is currently prescribed methotrexate 7.5 mg weekly, which has improved her symptoms.  She is interested in stopping methotrexate due to concern for long-term side effects.  She is also taking Norco 5-325 mg as needed for pain relief, which amounts to roughly once weekly per patient. -No medication changes today.  I agree with attempting to stop methotrexate at future appointment as her symptoms and exam findings seem more consistent with OA vs RA.  No previous autoimmune workup is available for review.

## 2023-03-18 NOTE — Assessment & Plan Note (Signed)
Reports taking hydrocodone-acetaminophen 5-3 and 25 mg as needed for migraine relief, which amounts to roughly once per week.  She has not tried any other medication previously for treatment of migraines.  PDMP reviewed.  No medication changes made today.

## 2023-03-19 ENCOUNTER — Telehealth: Payer: Self-pay | Admitting: Internal Medicine

## 2023-03-19 NOTE — Telephone Encounter (Signed)
Patient asked for a phone call about lab results and also would like a copy mailed to her. Home # [phone] and cell # [phone].

## 2023-03-19 NOTE — Telephone Encounter (Signed)
Awaiting lab results

## 2023-03-20 LAB — CMP14+EGFR
ALT: 20 [IU]/L (ref 0–32)
AST: 22 [IU]/L (ref 0–40)
Albumin: 4.6 g/dL (ref 3.8–4.8)
Alkaline Phosphatase: 117 IU/L (ref 44–121)
BUN/Creatinine Ratio: 19 (ref 12–28)
BUN: 20 mg/dL (ref 8–27)
Bilirubin Total: 0.5 mg/dL (ref 0.0–1.2)
CO2: 24 mmol/L (ref 20–29)
Calcium: 10 mg/dL (ref 8.7–10.3)
Chloride: 101 mmol/L (ref 96–106)
Creatinine, Ser: 1.04 mg/dL — ABNORMAL HIGH (ref 0.57–1.00)
Globulin, Total: 3 g/dL (ref 1.5–4.5)
Glucose: 107 mg/dL — ABNORMAL HIGH (ref 70–99)
Potassium: 4.5 mmol/L (ref 3.5–5.2)
Sodium: 140 mmol/L (ref 134–144)
Total Protein: 7.6 g/dL (ref 6.0–8.5)
eGFR: 56 mL/min/{1.73_m2} — ABNORMAL LOW (ref >59)

## 2023-03-20 LAB — CBC WITH DIFFERENTIAL/PLATELET
Basophils Absolute: 0 10*3/uL (ref 0.0–0.2)
Basos: 0 %
EOS (ABSOLUTE): 0.1 10*3/uL (ref 0.0–0.4)
Eos: 2 %
Hematocrit: 39.9 % (ref 34.0–46.6)
Hemoglobin: 12.5 g/dL (ref 11.1–15.9)
Immature Grans (Abs): 0 10*3/uL (ref 0.0–0.1)
Immature Granulocytes: 0 %
Lymphocytes Absolute: 0.9 10*3/uL (ref 0.7–3.1)
Lymphs: 21 %
MCH: 29.6 pg (ref 26.6–33.0)
MCHC: 31.3 g/dL — ABNORMAL LOW (ref 31.5–35.7)
MCV: 94 fL (ref 79–97)
Monocytes Absolute: 0.3 10*3/uL (ref 0.1–0.9)
Monocytes: 7 %
Neutrophils Absolute: 3.2 10*3/uL (ref 1.4–7.0)
Neutrophils: 70 %
Platelets: 235 10*3/uL (ref 150–450)
RBC: 4.23 x10E6/uL (ref 3.77–5.28)
RDW: 13.5 % (ref 11.7–15.4)
WBC: 4.5 10*3/uL (ref 3.4–10.8)

## 2023-03-20 LAB — TSH+FREE T4
Free T4: 1.45 ng/dL (ref 0.82–1.77)
TSH: 4.65 u[IU]/mL — ABNORMAL HIGH (ref 0.450–4.500)

## 2023-03-20 LAB — B12 AND FOLATE PANEL
Folate: 17.9 ng/mL (ref >3.0)
Vitamin B-12: 804 pg/mL (ref 232–1245)

## 2023-03-20 LAB — LIPID PANEL
Chol/HDL Ratio: 3.1 ratio (ref 0.0–4.4)
Cholesterol, Total: 142 mg/dL (ref 100–199)
HDL: 46 mg/dL (ref >39)
LDL Chol Calc (NIH): 79 mg/dL (ref 0–99)
Triglycerides: 89 mg/dL (ref 0–149)
VLDL Cholesterol Cal: 17 mg/dL (ref 5–40)

## 2023-03-20 LAB — VITAMIN D 25 HYDROXY (VIT D DEFICIENCY, FRACTURES): Vit D, 25-Hydroxy: 42 ng/mL (ref 30.0–100.0)

## 2023-03-20 LAB — HEMOGLOBIN A1C
Est. average glucose Bld gHb Est-mCnc: 126 mg/dL
Hgb A1c MFr Bld: 6 % — ABNORMAL HIGH (ref 4.8–5.6)

## 2023-03-20 LAB — HCV INTERPRETATION

## 2023-03-20 LAB — HCV AB W REFLEX TO QUANT PCR: HCV Ab: NONREACTIVE

## 2023-03-21 LAB — TOXASSURE SELECT 13 (MW), URINE

## 2023-03-25 ENCOUNTER — Telehealth: Payer: Self-pay | Admitting: Internal Medicine

## 2023-03-25 NOTE — Telephone Encounter (Signed)
Spoke with patient, she is aware of results. 

## 2023-03-25 NOTE — Telephone Encounter (Signed)
Copied from CRM [id number]. Topic: Clinical - Lab/Test Results >> Mar 25, 2023  4:04 PM Eunice Blase wrote: Reason for CRM: Pt called regarding lab and because of sensitive nature of results sent to PCP but went to voicemail. When trying to reconnect with pt could not restore connection. Please call pt [phone].

## 2023-03-25 NOTE — Telephone Encounter (Signed)
Patient asking for a call back about her lab results, sister has her mychart and cannot see it. Also can a copy be mailed to her. Try home and cell phone also.

## 2023-03-25 NOTE — Telephone Encounter (Signed)
Left message to call back on both home and cell phone 

## 2023-06-24 ENCOUNTER — Ambulatory Visit: Payer: Medicare Other | Admitting: Internal Medicine

## 2023-07-22 ENCOUNTER — Ambulatory Visit (INDEPENDENT_AMBULATORY_CARE_PROVIDER_SITE_OTHER): Payer: Medicare Other

## 2023-07-22 VITALS — BP 120/69 | Ht 68.5 in | Wt 201.0 lb

## 2023-07-22 DIAGNOSIS — Z0000 Encounter for general adult medical examination without abnormal findings: Secondary | ICD-10-CM

## 2023-07-22 DIAGNOSIS — Z1231 Encounter for screening mammogram for malignant neoplasm of breast: Secondary | ICD-10-CM

## 2023-07-22 NOTE — Patient Instructions (Signed)
 Claudia Griffith , Thank you for taking time to come for your Medicare Wellness Visit. I appreciate your ongoing commitment to your health goals. Please review the following plan we discussed and let me know if I can assist you in the future.   Referrals/Orders/Follow-Ups/Clinician Recommendations:   Next Medicare Annual Wellness Visit:  July 26, 2024 at 9:20 am telephone visit  You have an order for:  [x]   3D Mammogram  Your mammogram appointment is October 15, 2023 at 8:30 am The address where the mammogram bus will be is: [address] [place] Make sure to wear two-piece clothing.  No lotions powders or deodorants the day of the appointment Make sure to bring picture ID and insurance card.  Bring list of medications you are currently taking including any supplements.    This is a list of the screening recommended for you and due dates:  Health Maintenance  Topic Date Due   COVID-19 Vaccine (1) Never done   DTaP/Tdap/Td vaccine (1 - Tdap) Never done   Zoster (Shingles) Vaccine (1 of 2) Never done   Pneumonia Vaccine (1 of 1 - PCV) Never done   Mammogram  10/10/2023   Medicare Annual Wellness Visit  07/21/2024   DEXA scan (bone density measurement)  10/14/2024   Flu Shot  Completed   Hepatitis C Screening  Completed   HPV Vaccine  Aged Out    Advanced directives: (Declined) Advance directive discussed with you today. Even though you declined this today, please call our office should you change your mind, and we can give you the proper paperwork for you to fill out.  Next Medicare Annual Wellness Visit scheduled for next year: yes  Understanding Your Risk for Falls Millions of people have serious injuries from falls each year. It is important to understand your risk of falling. Talk with your health care provider about your risk and what you can do to lower it. If you do have a serious fall, make sure to tell your provider. Falling once raises your risk of falling  again. How can falls affect me? Serious injuries from falls are common. These include: Broken bones, such as hip fractures. Head injuries, such as traumatic brain injuries (TBI) or concussions. A fear of falling can cause you to avoid activities and stay at home. This can make your muscles weaker and raise your risk for a fall. What can increase my risk? There are a number of risk factors that increase your risk for falling. The more risk factors you have, the higher your risk of falling. Serious injuries from a fall happen most often to people who are older than 77 years old. Teenagers and young adults ages 45-29 are also at higher risk. Common risk factors include: Weakness in the lower body. Being generally weak or confused due to long-term (chronic) illness. Dizziness or balance problems. Poor vision. Medicines that cause dizziness or drowsiness. These may include: Medicines for your blood pressure, heart, anxiety, insomnia, or swelling (edema). Pain medicines. Muscle relaxants. Other risk factors include: Drinking alcohol. Having had a fall in the past. Having foot pain or wearing improper footwear. Working at a dangerous job. Having any of the following in your home: Tripping hazards, such as floor clutter or loose rugs. Poor lighting. Pets. Having dementia or memory loss. What actions can I take to lower my risk of falling?     Physical activity Stay physically fit. Do strength and balance exercises. Consider taking a regular class to build strength  and balance. Yoga and tai chi are good options. Vision Have your eyes checked every year and your prescription for glasses or contacts updated as needed. Shoes and walking aids Wear non-skid shoes. Wear shoes that have rubber soles and low heels. Do not wear high heels. Do not walk around the house in socks or slippers. Use a cane or walker as told by your provider. Home safety Attach secure railings on both sides of your  stairs. Install grab bars for your bathtub, shower, and toilet. Use a non-skid mat in your bathtub or shower. Attach bath mats securely with double-sided, non-slip rug tape. Use good lighting in all rooms. Keep a flashlight near your bed. Make sure there is a clear path from your bed to the bathroom. Use night-lights. Do not use throw rugs. Make sure all carpeting is taped or tacked down securely. Remove all clutter from walkways and stairways, including extension cords. Repair uneven or broken steps and floors. Avoid walking on icy or slippery surfaces. Walk on the grass instead of on icy or slick sidewalks. Use ice melter to get rid of ice on walkways in the winter. Use a cordless phone. Questions to ask your health care provider Can you help me check my risk for a fall? Do any of my medicines make me more likely to fall? Should I take a vitamin D supplement? What exercises can I do to improve my strength and balance? Should I make an appointment to have my vision checked? Do I need a bone density test to check for weak bones (osteoporosis)? Would it help to use a cane or a walker? Where to find more information Centers for Disease Control and Prevention, STEADI: TonerPromos.no Community-Based Fall Prevention Programs: TonerPromos.no [employer] on Aging: BaseRingTones.pl Contact a health care provider if: You fall at home. You are afraid of falling at home. You feel weak, drowsy, or dizzy. This information is not intended to replace advice given to you by your health care provider. Make sure you discuss any questions you have with your health care provider. Document Revised: 12/31/2021 Document Reviewed: 12/31/2021 Elsevier Patient Education  2024 Elsevier Inc.   Managing Pain Without Opioids Opioids are strong medicines used to treat moderate to severe pain. For some people, especially those who have long-term (chronic) pain, opioids may not be the best choice for pain management due  to: Side effects like nausea, constipation, and sleepiness. The risk of addiction (opioid use disorder). The longer you take opioids, the greater your risk of addiction. Pain that lasts for more than 3 months is called chronic pain. Managing chronic pain usually requires more than one approach and is often provided by a team of health care providers working together (multidisciplinary approach). Pain management may be done at a pain management center or pain clinic. How to manage pain without the use of opioids Use non-opioid medicines Non-opioid medicines for pain may include: Over-the-counter or prescription non-steroidal anti-inflammatory drugs (NSAIDs). These may be the first medicines used for pain. They work well for muscle and bone pain, and they reduce swelling. Acetaminophen. This over-the-counter medicine may work well for milder pain but not swelling. Antidepressants. These may be used to treat chronic pain. A certain type of antidepressant (tricyclics) is often used. These medicines are given in lower doses for pain than when used for depression. Anticonvulsants. These are usually used to treat seizures but may also reduce nerve (neuropathic) pain. Muscle relaxants. These relieve pain caused by sudden muscle tightening (spasms). You may  also use a pain medicine that is applied to the skin as a patch, cream, or gel (topical analgesic), such as a numbing medicine. These may cause fewer side effects than medicines taken by mouth. Do certain therapies as directed Some therapies can help with pain management. They include: Physical therapy. You will do exercises to gain strength and flexibility. A physical therapist may teach you exercises to move and stretch parts of your body that are weak, stiff, or painful. You can learn these exercises at physical therapy visits and practice them at home. Physical therapy may also involve: Massage. Heat wraps or applying heat or cold to affected  areas. Electrical signals that interrupt pain signals (transcutaneous electrical nerve stimulation, TENS). Weak lasers that reduce pain and swelling (low-level laser therapy). Signals from your body that help you learn to regulate pain (biofeedback). Occupational therapy. This helps you to learn ways to function at home and work with less pain. Recreational therapy. This involves trying new activities or hobbies, such as a physical activity or drawing. Mental health therapy, including: Cognitive behavioral therapy (CBT). This helps you learn coping skills for dealing with pain. Acceptance and commitment therapy (ACT) to change the way you think and react to pain. Relaxation therapies, including muscle relaxation exercises and mindfulness-based stress reduction. Pain management counseling. This may be individual, family, or group counseling.  Receive medical treatments Medical treatments for pain management include: Nerve block injections. These may include a pain blocker and anti-inflammatory medicines. You may have injections: Near the spine to relieve chronic back or neck pain. Into joints to relieve back or joint pain. Into nerve areas that supply a painful area to relieve body pain. Into muscles (trigger point injections) to relieve some painful muscle conditions. A medical device placed near your spine to help block pain signals and relieve nerve pain or chronic back pain (spinal cord stimulation device). Acupuncture. Follow these instructions at home Medicines Take over-the-counter and prescription medicines only as told by your health care provider. If you are taking pain medicine, ask your health care providers about possible side effects to watch out for. Do not drive or use heavy machinery while taking prescription opioid pain medicine. Lifestyle  Do not use drugs or alcohol to reduce pain. If you drink alcohol, limit how much you have to: 0-1 drink a day for women who are not  pregnant. 0-2 drinks a day for men. Know how much alcohol is in a drink. In the [place] one drink equals one 12 oz bottle of beer (355 mL), one 5 oz glass of wine (148 mL), or one 1 oz glass of hard liquor (44 mL). Do not use any products that contain nicotine or tobacco. These products include cigarettes, chewing tobacco, and vaping devices, such as e-cigarettes. If you need help quitting, ask your health care provider. Eat a healthy diet and maintain a healthy weight. Poor diet and excess weight may make pain worse. Eat foods that are high in fiber. These include fresh fruits and vegetables, whole grains, and beans. Limit foods that are high in fat and processed sugars, such as fried and sweet foods. Exercise regularly. Exercise lowers stress and may help relieve pain. Ask your health care provider what activities and exercises are safe for you. If your health care provider approves, join an exercise class that combines movement and stress reduction. Examples include yoga and tai chi. Get enough sleep. Lack of sleep may make pain worse. Lower stress as much as possible. Practice stress reduction  techniques as told by your therapist. General instructions Work with all your pain management providers to find the treatments that work best for you. You are an important member of your pain management team. There are many things you can do to reduce pain on your own. Consider joining an online or in-person support group for people who have chronic pain. Keep all follow-up visits. This is important. Where to find more information You can find more information about managing pain without opioids from: American Academy of Pain Medicine: painmed.org Institute for Chronic Pain: instituteforchronicpain.org American Chronic Pain Association: theacpa.org Contact a health care provider if: You have side effects from pain medicine. Your pain gets worse or does not get better with treatments or home  therapy. You are struggling with anxiety or depression. Summary Many types of pain can be managed without opioids. Chronic pain may respond better to pain management without opioids. Pain is best managed when you and a team of health care providers work together. Pain management without opioids may include non-opioid medicines, medical treatments, physical therapy, mental health therapy, and lifestyle changes. Tell your health care providers if your pain gets worse or is not being managed well enough. This information is not intended to replace advice given to you by your health care provider. Make sure you discuss any questions you have with your health care provider. Document Revised: 08/09/2020 Document Reviewed: 08/09/2020 Elsevier Patient Education  2024 [employer].

## 2023-07-22 NOTE — Progress Notes (Signed)
 Because this visit was a virtual/telehealth visit,  certain criteria was not obtained, such a blood pressure, CBG if applicable, and timed get up and go. Any medications not marked as "taking" were not mentioned during the medication reconciliation part of the visit. Any vitals not documented were not able to be obtained due to this being a telehealth visit or patient was unable to self-report a recent blood pressure reading due to a lack of equipment at home via telehealth. Vitals that have been documented are verbally provided by the patient.    Subjective:   Claudia Griffith is a 78 y.o. who presents for a Medicare Wellness preventive visit.  Visit Complete: Virtual I connected with  Claudia Griffith on 07/22/23 by a audio enabled telemedicine application and verified that I am speaking with the correct person using two identifiers.  Patient Location: Home  Provider Location: Home Office  I discussed the limitations of evaluation and management by telemedicine. The patient expressed understanding and agreed to proceed.  Vital Signs: Because this visit was a virtual/telehealth visit, some criteria may be missing or patient reported. Any vitals not documented were not able to be obtained and vitals that have been documented are patient reported.  VideoDeclined- This patient declined Librarian, academic. Therefore the visit was completed with audio only.  AWV Questionnaire: No: Patient Medicare AWV questionnaire was not completed prior to this visit.  Cardiac Risk Factors include: advanced age (>42men, >20 women);obesity (BMI >30kg/m2)     Objective:    Today's Vitals   07/22/23 1020  BP: 120/69  Weight: 201 lb (91.2 kg)  Height: 5' 8.5" (1.74 m)   Body mass index is 30.12 kg/m.     07/22/2023   10:24 AM 08/23/2022    9:10 AM 08/19/2022   11:18 AM 07/02/2022    1:38 PM  Advanced Directives  Does Patient Have a Medical Advance Directive? No No No No   Would patient like information on creating a medical advance directive? No - Patient declined No - Patient declined No - Patient declined No - Patient declined    Current Medications (verified) Outpatient Encounter Medications as of 07/22/2023  Medication Sig   aspirin EC 81 MG tablet Take 81 mg by mouth daily. Swallow whole. Only takes 2-3 times weekly.   atorvastatin (LIPITOR) 20 MG tablet Take 1 tablet by mouth daily.   EPINEPHrine (EPIPEN 2-PAK) 0.3 mg/0.3 mL IJ SOAJ injection Inject 0.3 mg into the muscle as needed for anaphylaxis.   methotrexate (RHEUMATREX) 7.5 MG tablet Take 7.5 mg by mouth once a week. Caution" Chemotherapy. Protect from light. Take on Saturday   HYDROcodone-acetaminophen (NORCO/VICODIN) 5-325 MG tablet Take 1 tablet by mouth 2 (two) times daily as needed. (Patient not taking: Reported on 07/22/2023)   No facility-administered encounter medications on file as of 07/22/2023.    Allergies (verified) Ceftin [cefuroxime axetil], Chocolate, Peanut-containing drug products, Pineapple, and Shellfish allergy   History: Past Medical History:  Diagnosis Date   Arthritis    Migraine    Past Surgical History:  Procedure Laterality Date   CATARACT EXTRACTION W/PHACO Left 07/05/2022   Procedure: CATARACT EXTRACTION PHACO AND INTRAOCULAR LENS PLACEMENT (IOC);  Surgeon: Pecolia Ades, MD;  Location: [facility];  Service: Ophthalmology;  Laterality: Left;  CDE: 3.73   CATARACT EXTRACTION W/PHACO Right 08/23/2022   Procedure: CATARACT EXTRACTION PHACO AND INTRAOCULAR LENS PLACEMENT (IOC);  Surgeon: Pecolia Ades, MD;  Location: [facility];  Service: Ophthalmology;  Laterality: Right;  CDE: 7.51   ELBOW ARTHROSCOPY Left    FOOT SURGERY Right    bone spur and hammer toe repair   KNEE SURGERY Right    x2 arthroscopy   History reviewed. No pertinent family history. Social History   Socioeconomic History   Marital status: Single    Spouse name: Not on file   Number of  children: Not on file   Years of education: Not on file   Highest education level: Not on file  Occupational History   Not on file  Tobacco Use   Smoking status: Never   Smokeless tobacco: Never  Vaping Use   Vaping status: Never Used  Substance and Sexual Activity   Alcohol use: Never   Drug use: Never   Sexual activity: Not on file  Other Topics Concern   Not on file  Social History Narrative   Not on file   Social Drivers of Health   Financial Resource Strain: Low Risk  (07/22/2023)   Overall Financial Resource Strain (CARDIA)    Difficulty of Paying Living Expenses: Not hard at all  Food Insecurity: No Food Insecurity (07/22/2023)   Hunger Vital Sign    Worried About Running Out of Food in the Last Year: Never true    Ran Out of Food in the Last Year: Never true  Transportation Needs: No Transportation Needs (07/22/2023)   PRAPARE - Administrator, Civil Service (Medical): No    Lack of Transportation (Non-Medical): No  Physical Activity: Sufficiently Active (07/22/2023)   Exercise Vital Sign    Days of Exercise per Week: 7 days    Minutes of Exercise per Session: 30 min  Stress: No Stress Concern Present (07/22/2023)   [employer] of Occupational Health - Occupational Stress Questionnaire    Feeling of Stress : Not at all  Social Connections: Moderately Integrated (07/22/2023)   Social Connection and Isolation Panel [NHANES]    Frequency of Communication with Friends and Family: More than three times a week    Frequency of Social Gatherings with Friends and Family: More than three times a week    Attends Religious Services: More than 4 times per year    Active Member of [employer] or Organizations: Yes    Attends Engineer, structural: More than 4 times per year    Marital Status: Never married    Tobacco Counseling Counseling given: Yes    Clinical Intake:  Pre-visit preparation completed: Yes  Pain : No/denies pain     BMI - recorded:  30.12 Nutritional Status: BMI > 30  Obese Nutritional Risks: None Diabetes: No  How often do you need to have someone help you when you read instructions, pamphlets, or other written materials from your doctor or pharmacy?: 1 - Never  Interpreter Needed?: No  Information entered by :: Maryjean Ka CMA   Activities of Daily Living     07/22/2023   10:24 AM 08/19/2022   11:18 AM  In your present state of health, do you have any difficulty performing the following activities:  Hearing? 0 0  Vision? 0 1  Difficulty concentrating or making decisions? 0   Walking or climbing stairs? 0 0  Dressing or bathing? 0 0  Doing errands, shopping? 0   Preparing Food and eating ? N   Using the Toilet? N   In the past six months, have you accidently leaked urine? N   Do you have problems with loss of bowel control? N  Managing your Medications? N   Managing your Finances? N   Housekeeping or managing your Housekeeping? N     Patient Care Team: Billie Lade, MD as PCP - General (Internal Medicine)  Indicate any recent Medical Services you may have received from other than [facility] providers in the past year (date may be approximate).     Assessment:   This is a routine wellness examination for Claudia Griffith.  Hearing/Vision screen Hearing Screening - Comments:: Patient denies any hearing difficulties.   Vision Screening - Comments:: Patient wears reading glasses only. Up to date with yearly exams.  Patient sees Dr. Daisy Lazar w/ [facility] [place] office.     Goals Addressed             This Visit's Progress    Patient Stated       I'd like to exercise more        Depression Screen     07/22/2023   10:25 AM 03/18/2023    8:08 AM  PHQ 2/9 Scores  PHQ - 2 Score 0 0  PHQ- 9 Score  0    Fall Risk     07/22/2023   10:24 AM 03/18/2023    8:08 AM  Fall Risk   Falls in the past year? 0 0  Number falls in past yr: 0 0  Injury with Fall? 0 0  Risk for fall due to : No Fall  Risks No Fall Risks  Follow up Falls prevention discussed;Education provided Falls evaluation completed    MEDICARE RISK AT HOME:  Medicare Risk at Home Any stairs in or around the home?: Yes If so, are there any without handrails?: No Home free of loose throw rugs in walkways, pet beds, electrical cords, etc?: Yes Adequate lighting in your home to reduce risk of falls?: Yes Life alert?: No Use of a cane, walker or w/c?: No Grab bars in the bathroom?: Yes Shower chair or bench in shower?: No Elevated toilet seat or a handicapped toilet?: Yes  TIMED UP AND GO:  Was the test performed?  No  Cognitive Function: 6CIT completed        07/22/2023   10:22 AM  6CIT Screen  What Year? 0 points  What month? 0 points  What time? 0 points  Count back from 20 0 points  Months in reverse 0 points  Repeat phrase 0 points  Total Score 0 points    Immunizations Immunization History  Administered Date(s) Administered   Fluad Quad(high Dose 65+) 03/04/2023   Influenza, High Dose Seasonal PF 02/19/2019    Screening Tests Health Maintenance  Topic Date Due   COVID-19 Vaccine (1) Never done   DTaP/Tdap/Td (1 - Tdap) Never done   Zoster Vaccines- Shingrix (1 of 2) Never done   Pneumonia Vaccine 10+ Years old (1 of 1 - PCV) Never done   MAMMOGRAM  10/10/2023   Medicare Annual Wellness (AWV)  07/21/2024   DEXA SCAN  10/14/2024   INFLUENZA VACCINE  Completed   Hepatitis C Screening  Completed   HPV VACCINES  Aged Out    Health Maintenance  Health Maintenance Due  Topic Date Due   COVID-19 Vaccine (1) Never done   DTaP/Tdap/Td (1 - Tdap) Never done   Zoster Vaccines- Shingrix (1 of 2) Never done   Pneumonia Vaccine 71+ Years old (1 of 1 - PCV) Never done   Health Maintenance Items Addressed: Mammogram scheduled  Additional Screening:  Vision Screening: Recommended annual ophthalmology  exams for early detection of glaucoma and other disorders of the eye.  Dental  Screening: Recommended annual dental exams for proper oral hygiene  Community Resource Referral / Chronic Care Management: CRR required this visit?  No   CCM required this visit?  No     Plan:     I have personally reviewed and noted the following in the patient's chart:   Medical and social history Use of alcohol, tobacco or illicit drugs  Current medications and supplements including opioid prescriptions. Patient is currently taking opioid prescriptions. Information provided to patient regarding non-opioid alternatives. Patient advised to discuss non-opioid treatment plan with their provider. Functional ability and status Nutritional status Physical activity Advanced directives List of other physicians Hospitalizations, surgeries, and ER visits in previous 12 months Vitals Screenings to include cognitive, depression, and falls Referrals and appointments  In addition, I have reviewed and discussed with patient certain preventive protocols, quality metrics, and best practice recommendations. A written personalized care plan for preventive services as well as general preventive health recommendations were provided to patient.     Jordan Hawks Khilynn Borntreger, CMA   07/22/2023   After Visit Summary: (MyChart) Due to this being a telephonic visit, the after visit summary with patients personalized plan was offered to patient via MyChart   Notes: Please refer to Routing Comments.

## 2023-08-05 ENCOUNTER — Ambulatory Visit (INDEPENDENT_AMBULATORY_CARE_PROVIDER_SITE_OTHER): Payer: Medicare Other | Admitting: Internal Medicine

## 2023-08-05 ENCOUNTER — Encounter: Payer: Self-pay | Admitting: Internal Medicine

## 2023-08-05 VITALS — BP 162/81 | HR 93 | Ht 68.0 in | Wt 199.8 lb

## 2023-08-05 DIAGNOSIS — M1990 Unspecified osteoarthritis, unspecified site: Secondary | ICD-10-CM

## 2023-08-05 DIAGNOSIS — R030 Elevated blood-pressure reading, without diagnosis of hypertension: Secondary | ICD-10-CM

## 2023-08-05 DIAGNOSIS — R944 Abnormal results of kidney function studies: Secondary | ICD-10-CM

## 2023-08-05 DIAGNOSIS — E782 Mixed hyperlipidemia: Secondary | ICD-10-CM

## 2023-08-05 DIAGNOSIS — R7303 Prediabetes: Secondary | ICD-10-CM

## 2023-08-05 NOTE — Progress Notes (Signed)
 Established Patient Office Visit  Subjective   Patient ID: Claudia Griffith, female    DOB: [birth year]  Age: 78 y.o. MRN: [id number]  Chief Complaint  Patient presents with   Care Management   Claudia Griffith returns to care today for routine follow-up.  She was last evaluated by me in November 2024 as a new patient presenting to establish care.  No medication changes are made at that time, repeat labs ordered, and 60-month follow-up was arranged.  There have been no acute interval events.  Today she reports feeling well and has no acute concerns to discuss.  Past Medical History:  Diagnosis Date   Arthritis    Migraine    Past Surgical History:  Procedure Laterality Date   CATARACT EXTRACTION W/PHACO Left 07/05/2022   Procedure: CATARACT EXTRACTION PHACO AND INTRAOCULAR LENS PLACEMENT (IOC);  Surgeon: Ardeth Krabbe, MD;  Location: [facility];  Service: Ophthalmology;  Laterality: Left;  CDE: 3.73   CATARACT EXTRACTION W/PHACO Right 08/23/2022   Procedure: CATARACT EXTRACTION PHACO AND INTRAOCULAR LENS PLACEMENT (IOC);  Surgeon: Ardeth Krabbe, MD;  Location: [facility];  Service: Ophthalmology;  Laterality: Right;  CDE: 7.51   ELBOW ARTHROSCOPY Left    FOOT SURGERY Right    bone spur and hammer toe repair   KNEE SURGERY Right    x2 arthroscopy   Social History   Tobacco Use   Smoking status: Never   Smokeless tobacco: Never  Vaping Use   Vaping status: Never Used  Substance Use Topics   Alcohol use: Never   Drug use: Never   History reviewed. No pertinent family history. Allergies  Allergen Reactions   Ceftin [Cefuroxime Axetil]     hives   Chocolate     Tongue swelling    Peanut-Containing Drug Products     Oral swelling    Pineapple     Tongue swelling    Shellfish Allergy     Tongue swelling    Review of Systems  Constitutional:  Negative for chills and fever.  HENT:  Negative for sore throat.   Respiratory:  Negative for cough and shortness of breath.    Cardiovascular:  Negative for chest pain, palpitations and leg swelling.  Gastrointestinal:  Negative for abdominal pain, blood in stool, constipation, diarrhea, nausea and vomiting.  Genitourinary:  Negative for dysuria and hematuria.  Musculoskeletal:  Negative for myalgias.  Skin:  Negative for itching and rash.  Neurological:  Negative for dizziness and headaches.  Psychiatric/Behavioral:  Negative for depression and suicidal ideas.      Objective:     BP (!) 162/81 (BP Location: Right Arm, Patient Position: Sitting, Cuff Size: Large)   Pulse 93   Ht 5\' 8"  (1.727 m)   Wt 199 lb 12.8 oz (90.6 kg)   SpO2 95%   BMI 30.38 kg/m  BP Readings from Last 3 Encounters:  08/05/23 (!) 162/81  07/22/23 120/69  03/18/23 (!) 153/75   Physical Exam Vitals reviewed.  Constitutional:      General: She is not in acute distress.    Appearance: Normal appearance. She is obese. She is not toxic-appearing.  HENT:     Head: Normocephalic and atraumatic.     Right Ear: External ear normal.     Left Ear: External ear normal.     Nose: Nose normal. No congestion or rhinorrhea.     Mouth/Throat:     Mouth: Mucous membranes are moist.     Pharynx: Oropharynx is clear. No oropharyngeal  exudate or posterior oropharyngeal erythema.  Eyes:     General: No scleral icterus.    Extraocular Movements: Extraocular movements intact.     Conjunctiva/sclera: Conjunctivae normal.     Pupils: Pupils are equal, round, and reactive to light.  Cardiovascular:     Rate and Rhythm: Normal rate and regular rhythm.     Pulses: Normal pulses.     Heart sounds: Normal heart sounds. No murmur heard.    No friction rub. No gallop.  Pulmonary:     Effort: Pulmonary effort is normal.     Breath sounds: Normal breath sounds. No wheezing, rhonchi or rales.  Abdominal:     General: Abdomen is flat. Bowel sounds are normal. There is no distension.     Palpations: Abdomen is soft.     Tenderness: There is no abdominal  tenderness.  Musculoskeletal:        General: No swelling. Normal range of motion.     Cervical back: Normal range of motion.     Right lower leg: No edema.     Left lower leg: No edema.  Lymphadenopathy:     Cervical: No cervical adenopathy.  Skin:    General: Skin is warm and dry.     Capillary Refill: Capillary refill takes less than 2 seconds.     Coloration: Skin is not jaundiced.  Neurological:     General: No focal deficit present.     Mental Status: She is alert and oriented to person, place, and time.  Psychiatric:        Mood and Affect: Mood normal.        Behavior: Behavior normal.   Last CBC Lab Results  Component Value Date   WBC 4.5 03/18/2023   HGB 12.5 03/18/2023   HCT 39.9 03/18/2023   MCV 94 03/18/2023   MCH 29.6 03/18/2023   RDW 13.5 03/18/2023   PLT 235 03/18/2023   Last metabolic panel Lab Results  Component Value Date   GLUCOSE 90 08/05/2023   NA 138 08/05/2023   K 4.7 08/05/2023   CL 100 08/05/2023   CO2 23 08/05/2023   BUN 15 08/05/2023   CREATININE 1.03 (H) 08/05/2023   EGFR 56 (L) 08/05/2023   CALCIUM 10.1 08/05/2023   PROT 7.6 03/18/2023   ALBUMIN 4.6 03/18/2023   LABGLOB 3.0 03/18/2023   BILITOT 0.5 03/18/2023   ALKPHOS 117 03/18/2023   AST 22 03/18/2023   ALT 20 03/18/2023   Last lipids Lab Results  Component Value Date   CHOL 142 03/18/2023   HDL 46 03/18/2023   LDLCALC 79 03/18/2023   TRIG 89 03/18/2023   CHOLHDL 3.1 03/18/2023   Last hemoglobin A1c Lab Results  Component Value Date   HGBA1C 6.0 (H) 03/18/2023   Last thyroid  functions Lab Results  Component Value Date   TSH 4.650 (H) 03/18/2023   Last vitamin D  Lab Results  Component Value Date   VD25OH 42.0 03/18/2023   Last vitamin B12 and Folate Lab Results  Component Value Date   VITAMINB12 804 03/18/2023   FOLATE 17.9 03/18/2023   The 10-year ASCVD risk score (Arnett DK, et al., 2019) is: 26.1%    Assessment & Plan:   Problem List Items Addressed  This Visit       Arthritis   RA vs OA as previously documented.  She is currently taking methotrexate 7.5 mg weekly as well as Norco 5-3 and 5 mg as needed for pain relief.  She is interested  in stopping methotrexate due to concern for long-term adverse side effects.  Treatment options reviewed.  I agree with trialing off methotrexate.  She will return to care if symptoms worsen.      Hyperlipidemia   Lipid panel updated in November 2024 reflects adequate control with atorvastatin 20 mg daily.  No medication changes are indicated at this time.      Prediabetes   A1c 6.0 on labs from November 2024, which is improved from 6.2 previously.  She will continue to focus on dietary efforts aimed at lowering her blood sugar.      Elevated blood pressure reading   No prior history of hypertension.  BP is elevated today, 157/81 initially and 162/81 on repeat.  She does not check her blood pressure regularly at home.  Will closely monitor at subsequent appointments.      Decreased GFR - Primary   GFR 56 on labs from November 2024.  No documented history of CKD.  Unclear if this represents her baseline or was transient in nature.  Repeat BMP ordered today.      Return in about 3 months (around 11/05/2023).   Tobi Fortes, MD

## 2023-08-05 NOTE — Patient Instructions (Signed)
 It was a pleasure to see you today.  Thank you for giving [place] the opportunity to be involved in your care.  Below is a brief recap of your visit and next steps.  We will plan to see you again in 3 months.  Summary OK to stop methotrexate Check chemistry panel Follow up in 3 months

## 2023-08-06 ENCOUNTER — Encounter: Payer: Self-pay | Admitting: Internal Medicine

## 2023-08-06 LAB — BASIC METABOLIC PANEL
BUN/Creatinine Ratio: 15 (ref 12–28)
BUN: 15 mg/dL (ref 8–27)
CO2: 23 mmol/L (ref 20–29)
Calcium: 10.1 mg/dL (ref 8.7–10.3)
Chloride: 100 mmol/L (ref 96–106)
Creatinine, Ser: 1.03 mg/dL — ABNORMAL HIGH (ref 0.57–1.00)
Glucose: 90 mg/dL (ref 70–99)
Potassium: 4.7 mmol/L (ref 3.5–5.2)
Sodium: 138 mmol/L (ref 134–144)
eGFR: 56 mL/min/{1.73_m2} — ABNORMAL LOW (ref >59)

## 2023-09-06 DIAGNOSIS — R944 Abnormal results of kidney function studies: Secondary | ICD-10-CM | POA: Insufficient documentation

## 2023-09-06 DIAGNOSIS — R030 Elevated blood-pressure reading, without diagnosis of hypertension: Secondary | ICD-10-CM | POA: Insufficient documentation

## 2023-09-06 NOTE — Assessment & Plan Note (Signed)
 GFR 56 on labs from November 2024.  No documented history of CKD.  Unclear if this represents her baseline or was transient in nature.  Repeat BMP ordered today.

## 2023-09-06 NOTE — Assessment & Plan Note (Signed)
 No prior history of hypertension.  BP is elevated today, 157/81 initially and 162/81 on repeat.  She does not check her blood pressure regularly at home.  Will closely monitor at subsequent appointments.

## 2023-09-06 NOTE — Assessment & Plan Note (Signed)
 A1c 6.0 on labs from November 2024, which is improved from 6.2 previously.  She will continue to focus on dietary efforts aimed at lowering her blood sugar.

## 2023-09-06 NOTE — Assessment & Plan Note (Signed)
 RA vs OA as previously documented.  She is currently taking methotrexate 7.5 mg weekly as well as Norco 5-3 and 5 mg as needed for pain relief.  She is interested in stopping methotrexate due to concern for long-term adverse side effects.  Treatment options reviewed.  I agree with trialing off methotrexate.  She will return to care if symptoms worsen.

## 2023-09-06 NOTE — Assessment & Plan Note (Signed)
 Lipid panel updated in November 2024 reflects adequate control with atorvastatin 20 mg daily.  No medication changes are indicated at this time.

## 2023-10-15 ENCOUNTER — Ambulatory Visit
Admission: RE | Admit: 2023-10-15 | Discharge: 2023-10-15 | Disposition: A | Discharge status: Home / Self Care | Source: Ambulatory Visit | Attending: Internal Medicine | Admitting: Internal Medicine

## 2023-10-15 DIAGNOSIS — Z1231 Encounter for screening mammogram for malignant neoplasm of breast: Secondary | ICD-10-CM

## 2023-10-22 DIAGNOSIS — H40003 Preglaucoma, unspecified, bilateral: Secondary | ICD-10-CM | POA: Diagnosis not present

## 2023-10-22 DIAGNOSIS — H53022 Refractive amblyopia, left eye: Secondary | ICD-10-CM | POA: Diagnosis not present

## 2023-10-22 DIAGNOSIS — H18513 Endothelial corneal dystrophy, bilateral: Secondary | ICD-10-CM | POA: Diagnosis not present

## 2023-10-22 DIAGNOSIS — Z961 Presence of intraocular lens: Secondary | ICD-10-CM | POA: Diagnosis not present

## 2023-11-05 ENCOUNTER — Ambulatory Visit (INDEPENDENT_AMBULATORY_CARE_PROVIDER_SITE_OTHER)

## 2023-11-05 VITALS — BP 160/80 | HR 90 | Ht 68.0 in | Wt 204.0 lb

## 2023-11-05 DIAGNOSIS — E782 Mixed hyperlipidemia: Secondary | ICD-10-CM

## 2023-11-05 DIAGNOSIS — Z87892 Personal history of anaphylaxis: Secondary | ICD-10-CM | POA: Diagnosis not present

## 2023-11-05 DIAGNOSIS — R7303 Prediabetes: Secondary | ICD-10-CM

## 2023-11-05 MED ORDER — EPINEPHRINE 0.3 MG/0.3ML IJ SOAJ: 0.3000 mg | INTRAMUSCULAR | 5 refills | Status: AC | PRN

## 2023-11-05 MED ORDER — ATORVASTATIN CALCIUM 20 MG PO TABS: 20.0000 mg | ORAL_TABLET | Freq: Every day | ORAL | 3 refills | Status: AC

## 2023-11-05 NOTE — Progress Notes (Signed)
   Established Patient Office Visit  Subjective   Patient ID: Claudia Griffith, female    DOB: 12-02-1945  Age: 78 y.o. MRN: [id number]  Chief Complaint  Patient presents with   Medical Management of Chronic Issues    Pt here for 3 mon follow up and needs a new rx for her epi pen it expires this year.    HPI  Patient Active Problem List   Diagnosis Date Noted   Elevated blood pressure reading 09/06/2023   Decreased GFR 09/06/2023   Hyperlipidemia 03/18/2023   Arthritis 03/18/2023   History of migraine headaches 03/18/2023   Prediabetes 03/18/2023   History of anaphylaxis 03/18/2023   Chronic, continuous use of opioids 03/18/2023      ROS    Objective:     BP (!) 160/80   Pulse 90   Ht 5' 8 (1.727 m)   Wt 204 lb 0.6 oz (92.6 kg)   SpO2 94%   BMI 31.02 kg/m   BP Readings from Last 3 Encounters:  11/05/23 (!) 160/80  08/05/23 (!) 162/81  07/22/23 120/69   Wt Readings from Last 3 Encounters:  11/05/23 204 lb 0.6 oz (92.6 kg)  08/05/23 199 lb 12.8 oz (90.6 kg)  07/22/23 201 lb (91.2 kg)     Physical Exam Vitals and nursing note reviewed.  Constitutional:      Appearance: Normal appearance.   Eyes:     Extraocular Movements: Extraocular movements intact.     Pupils: Pupils are equal, round, and reactive to light.    Cardiovascular:     Rate and Rhythm: Normal rate and regular rhythm.  Pulmonary:     Effort: Pulmonary effort is normal.     Breath sounds: Normal breath sounds.   Musculoskeletal:     Cervical back: Normal range of motion and neck supple.   Neurological:     Mental Status: She is alert and oriented to person, place, and time.   Psychiatric:        Mood and Affect: Mood normal.        Thought Content: Thought content normal.         The 10-year ASCVD risk score (Arnett DK, et al., 2019) is: 28.4%    Assessment & Plan:   Problem List Items Addressed This Visit       Other   Hyperlipidemia - Primary   Lipid panel  updated in November 2024 reflects adequate control with atorvastatin  20 mg daily.  Will check fasting labs today.  Follow-up according to lab results.  Atorvastatin  renewed.        Relevant Medications   atorvastatin  (LIPITOR) 20 MG tablet   EPINEPHrine  (EPIPEN  2-PAK) 0.3 mg/0.3 mL IJ SOAJ injection   Other Relevant Orders   CMP14+EGFR (Completed)   Lipid Profile (Completed)   Prediabetes   Recheck fasting labs.  F/U according to lab results.       Relevant Orders   CMP14+EGFR (Completed)   HgB A1c (Completed)   History of anaphylaxis   She endorses a history of anaphylaxis for which she is prescribed an EpiPen .  This has been refilled today at her request.      Relevant Medications   EPINEPHrine  (EPIPEN  2-PAK) 0.3 mg/0.3 mL IJ SOAJ injection    Return in about 6 months (around 05/06/2024) for chronic follow-up with PCP.    Leita Longs, FNP

## 2023-11-06 LAB — CMP14+EGFR
ALT: 18 IU/L (ref 0–32)
AST: 20 IU/L (ref 0–40)
Albumin: 4.4 g/dL (ref 3.8–4.8)
Alkaline Phosphatase: 106 IU/L (ref 44–121)
BUN/Creatinine Ratio: 20 (ref 12–28)
BUN: 19 mg/dL (ref 8–27)
Bilirubin Total: 0.4 mg/dL (ref 0.0–1.2)
CO2: 20 mmol/L (ref 20–29)
Calcium: 9.8 mg/dL (ref 8.7–10.3)
Chloride: 101 mmol/L (ref 96–106)
Creatinine, Ser: 0.97 mg/dL (ref 0.57–1.00)
Globulin, Total: 2.9 g/dL (ref 1.5–4.5)
Glucose: 91 mg/dL (ref 70–99)
Potassium: 4.8 mmol/L (ref 3.5–5.2)
Sodium: 139 mmol/L (ref 134–144)
Total Protein: 7.3 g/dL (ref 6.0–8.5)
eGFR: 60 mL/min/{1.73_m2} (ref >59)

## 2023-11-06 LAB — LIPID PANEL
Chol/HDL Ratio: 2.8 ratio (ref 0.0–4.4)
Cholesterol, Total: 136 mg/dL (ref 100–199)
HDL: 49 mg/dL (ref >39)
LDL Chol Calc (NIH): 73 mg/dL (ref 0–99)
Triglycerides: 66 mg/dL (ref 0–149)
VLDL Cholesterol Cal: 14 mg/dL (ref 5–40)

## 2023-11-06 LAB — HEMOGLOBIN A1C
Est. average glucose Bld gHb Est-mCnc: 128 mg/dL
Hgb A1c MFr Bld: 6.1 % — ABNORMAL HIGH (ref 4.8–5.6)

## 2023-11-09 NOTE — Assessment & Plan Note (Signed)
 Lipid panel updated in November 2024 reflects adequate control with atorvastatin  20 mg daily.  Will check fasting labs today.  Follow-up according to lab results.  Atorvastatin  renewed.

## 2023-11-09 NOTE — Assessment & Plan Note (Signed)
 She endorses a history of anaphylaxis for which she is prescribed an EpiPen.  This has been refilled today at her request.

## 2023-11-09 NOTE — Assessment & Plan Note (Signed)
 Recheck fasting labs.  F/U according to lab results.

## 2023-11-30 ENCOUNTER — Ambulatory Visit: Payer: Self-pay

## 2024-02-12 DIAGNOSIS — H04123 Dry eye syndrome of bilateral lacrimal glands: Secondary | ICD-10-CM | POA: Diagnosis not present

## 2024-02-24 DIAGNOSIS — K080 Exfoliation of teeth due to systemic causes: Secondary | ICD-10-CM | POA: Diagnosis not present

## 2024-04-27 DIAGNOSIS — H66001 Acute suppurative otitis media without spontaneous rupture of ear drum, right ear: Secondary | ICD-10-CM | POA: Diagnosis not present

## 2024-05-03 ENCOUNTER — Ambulatory Visit: Payer: Self-pay

## 2024-05-03 NOTE — Telephone Encounter (Signed)
 FYI Only or Action Required?: Action required by provider: update on patient condition.  Patient was last seen in primary care on 11/05/2023 by Bevely Doffing, FNP.  Called Nurse Triage reporting Temporomandibular Joint Pain.  Symptoms began a week ago.  Interventions attempted: OTC medications: Ibuprofen, Motrin, Ice.  Triage Disposition: Go to ED Now (or PCP Triage)  Patient/caregiver understands and will follow disposition?: No, wishes to speak with PCP               Copied from CRM #[id number]. Topic: Clinical - Red Word Triage >> May 03, 2024  9:23 AM Thersia BROCKS wrote: Red Word that prompted transfer to Nurse Triage: Patient called in stated she is experencing TMJ, and in a lot of pain . Dentist is closed until after the holiday Reason for Disposition  [1] New-onset jaw pain AND [2] unknown cause AND [3] at least one cardiac risk factor (e.g., 45 years or older, diabetes, high cholesterol, hypertension, obesity, smoker or strong family history of heart disease)  Answer Assessment - Initial Assessment Questions This RN recommends pt goes to ED but pt refused. This RN notified [facility] of pt refusal of ED disposition. Pt wants a call back today at [phone].   TMJ pain in jaw started on 12/15, pt went to urgent care on 12/15; pt was told to take ibuprofen or motrin and use ice- helps but still a lot of pain Right sided jaw pain, warm to touch, swelling (mild) Teeth are now hurting (new symptom) Crown put on one month ago Headache- 6/10 Pt states she has been eating soft foods Pain level: 10/10 intermittent, this morning it's a little better, but not much  Not opening mouth wide due to pain  Pt states she called dentist office and they are out until 12/28.   Denies: fever, difficulty breathing  Denies HX of TMJ  Protocols used: Face Pain-A-AH

## 2024-05-03 NOTE — Telephone Encounter (Signed)
 Pt states better today than it was this morning been eating a lot of soft stuff, thinks she's just going to eat soft things not going tot he hospital because of all the flus going around, she's not going to the ed because her Dentist will be back in office Dec. 28

## 2024-05-10 ENCOUNTER — Ambulatory Visit

## 2024-07-26 ENCOUNTER — Ambulatory Visit

## 2024-07-28 ENCOUNTER — Ambulatory Visit

## 2024-09-01 ENCOUNTER — Ambulatory Visit: Payer: Self-pay
